# Patient Record
Sex: Female | Born: 1990 | Race: Black or African American | Hispanic: No | Marital: Married | Smoking: Never smoker
Health system: Southern US, Community
[De-identification: ages and names within clinical notes are randomized; demographics above are authoritative.]

## PROBLEM LIST (undated history)

## (undated) DIAGNOSIS — F419 Anxiety disorder, unspecified: Secondary | ICD-10-CM

## (undated) DIAGNOSIS — J45909 Unspecified asthma, uncomplicated: Secondary | ICD-10-CM

## (undated) DIAGNOSIS — D563 Thalassemia minor: Secondary | ICD-10-CM

## (undated) HISTORY — PX: TONSILLECTOMY AND ADENOIDECTOMY: SHX28

---

## 2018-05-14 DIAGNOSIS — H1031 Unspecified acute conjunctivitis, right eye: Secondary | ICD-10-CM | POA: Diagnosis not present

## 2018-07-31 DIAGNOSIS — J4520 Mild intermittent asthma, uncomplicated: Secondary | ICD-10-CM | POA: Diagnosis not present

## 2018-07-31 DIAGNOSIS — J069 Acute upper respiratory infection, unspecified: Secondary | ICD-10-CM | POA: Diagnosis not present

## 2018-08-17 DIAGNOSIS — J069 Acute upper respiratory infection, unspecified: Secondary | ICD-10-CM | POA: Diagnosis not present

## 2018-08-17 DIAGNOSIS — R6889 Other general symptoms and signs: Secondary | ICD-10-CM | POA: Diagnosis not present

## 2018-10-02 DIAGNOSIS — J4520 Mild intermittent asthma, uncomplicated: Secondary | ICD-10-CM | POA: Diagnosis not present

## 2018-10-02 DIAGNOSIS — Z0000 Encounter for general adult medical examination without abnormal findings: Secondary | ICD-10-CM | POA: Diagnosis not present

## 2018-10-02 DIAGNOSIS — Z23 Encounter for immunization: Secondary | ICD-10-CM | POA: Diagnosis not present

## 2019-01-25 DIAGNOSIS — Z7951 Long term (current) use of inhaled steroids: Secondary | ICD-10-CM | POA: Diagnosis not present

## 2019-01-25 DIAGNOSIS — N83201 Unspecified ovarian cyst, right side: Secondary | ICD-10-CM | POA: Diagnosis not present

## 2019-01-25 DIAGNOSIS — N939 Abnormal uterine and vaginal bleeding, unspecified: Secondary | ICD-10-CM | POA: Diagnosis not present

## 2019-01-25 DIAGNOSIS — N83209 Unspecified ovarian cyst, unspecified side: Secondary | ICD-10-CM | POA: Diagnosis not present

## 2019-01-25 DIAGNOSIS — J45909 Unspecified asthma, uncomplicated: Secondary | ICD-10-CM | POA: Diagnosis not present

## 2019-01-25 DIAGNOSIS — Z975 Presence of (intrauterine) contraceptive device: Secondary | ICD-10-CM | POA: Diagnosis not present

## 2019-01-25 DIAGNOSIS — N83202 Unspecified ovarian cyst, left side: Secondary | ICD-10-CM | POA: Diagnosis not present

## 2019-01-25 DIAGNOSIS — R102 Pelvic and perineal pain: Secondary | ICD-10-CM | POA: Diagnosis not present

## 2019-04-22 DIAGNOSIS — Z30432 Encounter for removal of intrauterine contraceptive device: Secondary | ICD-10-CM | POA: Diagnosis not present

## 2019-05-14 DIAGNOSIS — L02429 Furuncle of limb, unspecified: Secondary | ICD-10-CM | POA: Diagnosis not present

## 2019-08-07 DIAGNOSIS — M25511 Pain in right shoulder: Secondary | ICD-10-CM | POA: Diagnosis not present

## 2019-08-07 DIAGNOSIS — F419 Anxiety disorder, unspecified: Secondary | ICD-10-CM | POA: Diagnosis not present

## 2019-08-07 DIAGNOSIS — F322 Major depressive disorder, single episode, severe without psychotic features: Secondary | ICD-10-CM | POA: Diagnosis not present

## 2019-08-27 DIAGNOSIS — N912 Amenorrhea, unspecified: Secondary | ICD-10-CM | POA: Diagnosis not present

## 2019-08-29 DIAGNOSIS — F320 Major depressive disorder, single episode, mild: Secondary | ICD-10-CM | POA: Diagnosis not present

## 2019-08-29 DIAGNOSIS — F909 Attention-deficit hyperactivity disorder, unspecified type: Secondary | ICD-10-CM | POA: Diagnosis not present

## 2019-09-10 DIAGNOSIS — F320 Major depressive disorder, single episode, mild: Secondary | ICD-10-CM | POA: Diagnosis not present

## 2019-09-10 DIAGNOSIS — F909 Attention-deficit hyperactivity disorder, unspecified type: Secondary | ICD-10-CM | POA: Diagnosis not present

## 2019-10-01 DIAGNOSIS — O99211 Obesity complicating pregnancy, first trimester: Secondary | ICD-10-CM | POA: Diagnosis not present

## 2019-10-01 DIAGNOSIS — Z3201 Encounter for pregnancy test, result positive: Secondary | ICD-10-CM | POA: Diagnosis not present

## 2019-10-01 DIAGNOSIS — Z113 Encounter for screening for infections with a predominantly sexual mode of transmission: Secondary | ICD-10-CM | POA: Diagnosis not present

## 2019-10-01 DIAGNOSIS — D649 Anemia, unspecified: Secondary | ICD-10-CM | POA: Diagnosis not present

## 2019-10-01 DIAGNOSIS — Z3400 Encounter for supervision of normal first pregnancy, unspecified trimester: Secondary | ICD-10-CM | POA: Diagnosis not present

## 2019-10-01 DIAGNOSIS — O3680X0 Pregnancy with inconclusive fetal viability, not applicable or unspecified: Secondary | ICD-10-CM | POA: Diagnosis not present

## 2019-10-01 DIAGNOSIS — Z8709 Personal history of other diseases of the respiratory system: Secondary | ICD-10-CM | POA: Diagnosis not present

## 2019-10-01 DIAGNOSIS — Z3401 Encounter for supervision of normal first pregnancy, first trimester: Secondary | ICD-10-CM | POA: Diagnosis not present

## 2019-10-01 DIAGNOSIS — Z3A09 9 weeks gestation of pregnancy: Secondary | ICD-10-CM | POA: Diagnosis not present

## 2019-11-26 DIAGNOSIS — D649 Anemia, unspecified: Secondary | ICD-10-CM | POA: Diagnosis not present

## 2019-11-26 DIAGNOSIS — O99211 Obesity complicating pregnancy, first trimester: Secondary | ICD-10-CM | POA: Diagnosis not present

## 2019-11-26 DIAGNOSIS — E010 Iodine-deficiency related diffuse (endemic) goiter: Secondary | ICD-10-CM | POA: Diagnosis not present

## 2019-11-26 DIAGNOSIS — R718 Other abnormality of red blood cells: Secondary | ICD-10-CM | POA: Diagnosis not present

## 2019-12-10 DIAGNOSIS — Z3689 Encounter for other specified antenatal screening: Secondary | ICD-10-CM | POA: Diagnosis not present

## 2020-01-07 DIAGNOSIS — O352XX0 Maternal care for (suspected) hereditary disease in fetus, not applicable or unspecified: Secondary | ICD-10-CM | POA: Diagnosis not present

## 2020-01-07 DIAGNOSIS — Z3A23 23 weeks gestation of pregnancy: Secondary | ICD-10-CM | POA: Diagnosis not present

## 2020-02-05 DIAGNOSIS — Z3400 Encounter for supervision of normal first pregnancy, unspecified trimester: Secondary | ICD-10-CM | POA: Diagnosis not present

## 2020-02-08 ENCOUNTER — Inpatient Hospital Stay (HOSPITAL_COMMUNITY)
Admission: AD | Admit: 2020-02-08 | Discharge: 2020-02-08 | Disposition: A | Discharge status: Home / Self Care | Payer: 59 | Attending: Obstetrics & Gynecology | Admitting: Obstetrics & Gynecology

## 2020-02-08 ENCOUNTER — Other Ambulatory Visit: Payer: Self-pay

## 2020-02-08 ENCOUNTER — Encounter (HOSPITAL_COMMUNITY): Payer: Self-pay | Admitting: Obstetrics & Gynecology

## 2020-02-08 DIAGNOSIS — Z882 Allergy status to sulfonamides status: Secondary | ICD-10-CM | POA: Diagnosis not present

## 2020-02-08 DIAGNOSIS — O26892 Other specified pregnancy related conditions, second trimester: Secondary | ICD-10-CM | POA: Insufficient documentation

## 2020-02-08 DIAGNOSIS — O99342 Other mental disorders complicating pregnancy, second trimester: Secondary | ICD-10-CM | POA: Insufficient documentation

## 2020-02-08 DIAGNOSIS — J45909 Unspecified asthma, uncomplicated: Secondary | ICD-10-CM | POA: Insufficient documentation

## 2020-02-08 DIAGNOSIS — R5383 Other fatigue: Secondary | ICD-10-CM | POA: Diagnosis not present

## 2020-02-08 DIAGNOSIS — Z79899 Other long term (current) drug therapy: Secondary | ICD-10-CM | POA: Diagnosis not present

## 2020-02-08 DIAGNOSIS — Z3A27 27 weeks gestation of pregnancy: Secondary | ICD-10-CM | POA: Insufficient documentation

## 2020-02-08 DIAGNOSIS — R42 Dizziness and giddiness: Secondary | ICD-10-CM | POA: Diagnosis not present

## 2020-02-08 DIAGNOSIS — F419 Anxiety disorder, unspecified: Secondary | ICD-10-CM | POA: Diagnosis not present

## 2020-02-08 DIAGNOSIS — D563 Thalassemia minor: Secondary | ICD-10-CM | POA: Diagnosis not present

## 2020-02-08 DIAGNOSIS — O99512 Diseases of the respiratory system complicating pregnancy, second trimester: Secondary | ICD-10-CM | POA: Diagnosis not present

## 2020-02-08 HISTORY — DX: Anxiety disorder, unspecified: F41.9

## 2020-02-08 HISTORY — DX: Thalassemia minor: D56.3

## 2020-02-08 HISTORY — DX: Unspecified asthma, uncomplicated: J45.909

## 2020-02-08 LAB — URINALYSIS, ROUTINE W REFLEX MICROSCOPIC
Bilirubin Urine: NEGATIVE
Glucose, UA: NEGATIVE mg/dL
Hgb urine dipstick: NEGATIVE
Ketones, ur: 20 mg/dL — AB
Nitrite: NEGATIVE
Protein, ur: NEGATIVE mg/dL
Specific Gravity, Urine: 1.011 (ref 1.005–1.030)
pH: 6 (ref 5.0–8.0)

## 2020-02-08 LAB — COMPREHENSIVE METABOLIC PANEL
ALT: 41 U/L (ref 0–44)
AST: 32 U/L (ref 15–41)
Albumin: 2.7 g/dL — ABNORMAL LOW (ref 3.5–5.0)
Alkaline Phosphatase: 101 U/L (ref 38–126)
Anion gap: 9 (ref 5–15)
BUN: 8 mg/dL (ref 6–20)
CO2: 20 mmol/L — ABNORMAL LOW (ref 22–32)
Calcium: 8.7 mg/dL — ABNORMAL LOW (ref 8.9–10.3)
Chloride: 105 mmol/L (ref 98–111)
Creatinine, Ser: 0.49 mg/dL (ref 0.44–1.00)
GFR calc Af Amer: >60 mL/min (ref >60)
GFR calc non Af Amer: >60 mL/min (ref >60)
Glucose, Bld: 72 mg/dL (ref 70–99)
Potassium: 3.9 mmol/L (ref 3.5–5.1)
Sodium: 134 mmol/L — ABNORMAL LOW (ref 135–145)
Total Bilirubin: 0.3 mg/dL (ref 0.3–1.2)
Total Protein: 6.5 g/dL (ref 6.5–8.1)

## 2020-02-08 LAB — CBC
HCT: 33.2 % — ABNORMAL LOW (ref 36.0–46.0)
Hemoglobin: 10.1 g/dL — ABNORMAL LOW (ref 12.0–15.0)
MCH: 23.2 pg — ABNORMAL LOW (ref 26.0–34.0)
MCHC: 30.4 g/dL (ref 30.0–36.0)
MCV: 76.3 fL — ABNORMAL LOW (ref 80.0–100.0)
Platelets: 219 10*3/uL (ref 150–400)
RBC: 4.35 MIL/uL (ref 3.87–5.11)
RDW: 14.7 % (ref 11.5–15.5)
WBC: 8.6 10*3/uL (ref 4.0–10.5)
nRBC: 0 % (ref 0.0–0.2)

## 2020-02-08 NOTE — MAU Provider Note (Signed)
History     CSN: [id number]  Arrival date and time: 02/08/20 1638   First Provider Initiated Contact with Patient 02/08/20 1726      Chief Complaint  Patient presents with  . Dizziness  . Nausea  . Emesis  . Abdominal Pain   HPI Sandra Caldwell is a 29 y.o. G1P0 at [redacted]w[redacted]d who presents with dizziness and fatigue. 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 She states she ate only pizza toppings and a spoonful of cake at lunch. She reports 2 tumblers of water total today. She denies any leaking or vaginal bleeding. Reports normal fetal movement.   OB History    Gravida  1   Para      Term      Preterm      AB      Living        SAB      TAB      Ectopic      Multiple      Live Births              Past Medical History:  Diagnosis Date  . Anxiety   . Asthma   . Thalassemia minor     History reviewed. No pertinent surgical history.  History reviewed. No pertinent family history.  Social History   Tobacco Use  . Smoking status: Never Smoker  . Smokeless tobacco: Never Used  Vaping Use  . Vaping Use: Never used  Substance Use Topics  . Alcohol use: Not Currently  . Drug use: Never    Allergies:  Allergies  Allergen Reactions  . Sulfa Antibiotics Nausea Only    Medications Prior to Admission  Medication Sig Dispense Refill Last Dose  . albuterol (VENTOLIN HFA) 108 (90 Base) MCG/ACT inhaler Inhale 2 puffs into the lungs every 6 (six) hours as needed for wheezing or shortness of breath.     . busPIRone (BUSPAR) 10 MG tablet Take 10 mg by mouth 3 (three) times daily. Pt states up to 3 times a day as needed.   Past Week at Unknown time  . ondansetron (ZOFRAN) 4 MG tablet Take 4 mg by mouth every 8 (eight) hours as needed for nausea or vomiting.   Past Month at Unknown time  . Prenatal Vit-Fe Fumarate-FA (PRENATAL MULTIVITAMIN) TABS tablet Take 1 tablet by mouth daily at  12 noon.   02/08/2020 at Unknown time    Review of Systems  Constitutional: Negative.  Negative for fatigue and fever.  HENT: Negative.   Respiratory: Negative.  Negative for shortness of breath.   Cardiovascular: Negative.  Negative for chest pain.  Gastrointestinal: Negative.  Negative for abdominal pain, constipation, diarrhea, nausea and vomiting.  Genitourinary: Negative.  Negative for dysuria, vaginal bleeding and vaginal discharge.  Neurological: Positive for dizziness and light-headedness. Negative for headaches.   Physical Exam   Blood pressure 114/69, pulse 99, temperature 98.3 F (36.8 C), temperature source Oral, resp. rate 16, height 5\' 4"  (1.626 m), weight 86.5 kg, SpO2 98 %.  Physical Exam Vitals and nursing note reviewed.  Constitutional:      General: She is not in acute distress.    Appearance: She is well-developed.  HENT:     Head: Normocephalic.  Eyes:     Pupils: Pupils are equal, round, and reactive to light.  Cardiovascular:     Rate and Rhythm: Normal rate and regular  rhythm.     Heart sounds: Normal heart sounds.  Pulmonary:     Effort: Pulmonary effort is normal. No respiratory distress.     Breath sounds: Normal breath sounds.  Abdominal:     General: Bowel sounds are normal. There is no distension.     Palpations: Abdomen is soft.     Tenderness: There is no abdominal tenderness.  Skin:    General: Skin is warm and dry.  Neurological:     Mental Status: She is alert and oriented to person, place, and time.  Psychiatric:        Behavior: Behavior normal.        Thought Content: Thought content normal.        Judgment: Judgment normal.    Fetal Tracing:  Baseline: 140 Variability: moderate  Accels: 10x10 Decels: none  Toco: none   MAU Course  Procedures Results for orders placed or performed during the hospital encounter of 02/08/20 (from the past 24 hour(s))  Urinalysis, Routine w reflex microscopic     Status: Abnormal    Collection Time: 02/08/20  5:05 PM  Result Value Ref Range   Color, Urine YELLOW YELLOW   APPearance CLEAR CLEAR   Specific Gravity, Urine 1.011 1.005 - 1.030   pH 6.0 5.0 - 8.0   Glucose, UA NEGATIVE NEGATIVE mg/dL   Hgb urine dipstick NEGATIVE NEGATIVE   Bilirubin Urine NEGATIVE NEGATIVE   Ketones, ur 20 (A) NEGATIVE mg/dL   Protein, ur NEGATIVE NEGATIVE mg/dL   Nitrite NEGATIVE NEGATIVE   Leukocytes,Ua TRACE (A) NEGATIVE   RBC / HPF 0-5 0 - 5 RBC/hpf   WBC, UA 0-5 0 - 5 WBC/hpf   Bacteria, UA RARE (A) NONE SEEN   Squamous Epithelial / LPF 0-5 0 - 5   Mucus PRESENT   CBC     Status: Abnormal   Collection Time: 02/08/20  6:03 PM  Result Value Ref Range   WBC 8.6 4.0 - 10.5 K/uL   RBC 4.35 3.87 - 5.11 MIL/uL   Hemoglobin 10.1 (L) 12.0 - 15.0 g/dL   HCT 25.4 (L) 36 - 46 %   MCV 76.3 (L) 80.0 - 100.0 fL   MCH 23.2 (L) 26.0 - 34.0 pg   MCHC 30.4 30.0 - 36.0 g/dL   RDW 27.0 62.3 - 76.2 %   Platelets 219 150 - 400 K/uL   nRBC 0.0 0.0 - 0.2 %  Comprehensive metabolic panel     Status: Abnormal   Collection Time: 02/08/20  6:03 PM  Result Value Ref Range   Sodium 134 (L) 135 - 145 mmol/L   Potassium 3.9 3.5 - 5.1 mmol/L   Chloride 105 98 - 111 mmol/L   CO2 20 (L) 22 - 32 mmol/L   Glucose, Bld 72 70 - 99 mg/dL   BUN 8 6 - 20 mg/dL   Creatinine, Ser 8.31 0.44 - 1.00 mg/dL   Calcium 8.7 (L) 8.9 - 10.3 mg/dL   Total Protein 6.5 6.5 - 8.1 g/dL   Albumin 2.7 (L) 3.5 - 5.0 g/dL   AST 32 15 - 41 U/L   ALT 41 0 - 44 U/L   Alkaline Phosphatase 101 38 - 126 U/L   Total Bilirubin 0.3 0.3 - 1.2 mg/dL   GFR calc non Af Amer >60 >60 mL/min   GFR calc Af Amer >60 >60 mL/min   Anion gap 9 5 - 15   MDM UA CBC, CMP per patient request  Lengthy discussion with  patient about proper nutrition in pregnancy and increasing PO hydration  Assessment and Plan   1. Dizziness   2. Fatigue, unspecified type   3. [redacted] weeks gestation of pregnancy    -Discharge home in stable  condition -Preterm precautions discussed -Patient advised to follow-up with OB in [place] as scheduled for prenatal care -Patient may return to MAU as needed or if her condition were to change or worsen   Rolm Bookbinder CNM 02/08/2020, 5:26 PM

## 2020-02-08 NOTE — Discharge Instructions (Signed)

## 2020-02-08 NOTE — MAU Note (Signed)
Sandra Caldwell is a 29 y.o. at [redacted]w[redacted]d here in MAU reporting: been feeling, dizzy, fatigue and has been seeing spots. This started around 2. Having some n/v. Emesis x 2 today. Having some abdominal pain. No discharge or bleeding. +FM  Onset of complaint: today  Pain score: 4/10  Vitals:   02/08/20 1655  BP: 121/77  Pulse: (!) 102  Resp: 16  Temp: 98.3 F (36.8 C)  SpO2: 100%     FHT: +FM  Lab orders placed from triage: UA

## 2020-02-17 DIAGNOSIS — Z3400 Encounter for supervision of normal first pregnancy, unspecified trimester: Secondary | ICD-10-CM | POA: Diagnosis not present

## 2020-02-17 DIAGNOSIS — O99810 Abnormal glucose complicating pregnancy: Secondary | ICD-10-CM | POA: Diagnosis not present

## 2020-03-03 DIAGNOSIS — Z3493 Encounter for supervision of normal pregnancy, unspecified, third trimester: Secondary | ICD-10-CM | POA: Diagnosis not present

## 2020-03-03 DIAGNOSIS — Z3400 Encounter for supervision of normal first pregnancy, unspecified trimester: Secondary | ICD-10-CM | POA: Diagnosis not present

## 2020-03-03 DIAGNOSIS — Z23 Encounter for immunization: Secondary | ICD-10-CM | POA: Diagnosis not present

## 2020-03-27 DIAGNOSIS — Z3483 Encounter for supervision of other normal pregnancy, third trimester: Secondary | ICD-10-CM | POA: Diagnosis not present

## 2020-03-27 DIAGNOSIS — Z3482 Encounter for supervision of other normal pregnancy, second trimester: Secondary | ICD-10-CM | POA: Diagnosis not present

## 2020-03-31 DIAGNOSIS — R05 Cough: Secondary | ICD-10-CM | POA: Diagnosis not present

## 2020-03-31 DIAGNOSIS — Z882 Allergy status to sulfonamides status: Secondary | ICD-10-CM | POA: Diagnosis not present

## 2020-03-31 DIAGNOSIS — Z79899 Other long term (current) drug therapy: Secondary | ICD-10-CM | POA: Diagnosis not present

## 2020-03-31 DIAGNOSIS — R000 Tachycardia, unspecified: Secondary | ICD-10-CM | POA: Diagnosis not present

## 2020-03-31 DIAGNOSIS — O99891 Other specified diseases and conditions complicating pregnancy: Secondary | ICD-10-CM | POA: Diagnosis not present

## 2020-03-31 DIAGNOSIS — R519 Headache, unspecified: Secondary | ICD-10-CM | POA: Diagnosis not present

## 2020-03-31 DIAGNOSIS — U071 COVID-19: Secondary | ICD-10-CM | POA: Diagnosis not present

## 2020-03-31 DIAGNOSIS — R509 Fever, unspecified: Secondary | ICD-10-CM | POA: Diagnosis not present

## 2020-03-31 DIAGNOSIS — Z3A00 Weeks of gestation of pregnancy not specified: Secondary | ICD-10-CM | POA: Diagnosis not present

## 2020-03-31 DIAGNOSIS — R110 Nausea: Secondary | ICD-10-CM | POA: Diagnosis not present

## 2020-04-06 ENCOUNTER — Inpatient Hospital Stay (HOSPITAL_COMMUNITY)
Admission: AD | Admit: 2020-04-06 | Discharge: 2020-04-08 | DRG: 831 | Disposition: A | Discharge status: Home / Self Care | Payer: 59 | Attending: Obstetrics and Gynecology | Admitting: Obstetrics and Gynecology

## 2020-04-06 ENCOUNTER — Inpatient Hospital Stay (HOSPITAL_COMMUNITY): Payer: 59

## 2020-04-06 ENCOUNTER — Encounter (HOSPITAL_COMMUNITY): Payer: Self-pay | Admitting: Obstetrics and Gynecology

## 2020-04-06 ENCOUNTER — Other Ambulatory Visit: Payer: Self-pay

## 2020-04-06 DIAGNOSIS — D563 Thalassemia minor: Secondary | ICD-10-CM | POA: Diagnosis present

## 2020-04-06 DIAGNOSIS — R0602 Shortness of breath: Secondary | ICD-10-CM

## 2020-04-06 DIAGNOSIS — J45909 Unspecified asthma, uncomplicated: Secondary | ICD-10-CM | POA: Diagnosis present

## 2020-04-06 DIAGNOSIS — J069 Acute upper respiratory infection, unspecified: Secondary | ICD-10-CM | POA: Diagnosis present

## 2020-04-06 DIAGNOSIS — J1282 Pneumonia due to coronavirus disease 2019: Secondary | ICD-10-CM | POA: Diagnosis present

## 2020-04-06 DIAGNOSIS — O98513 Other viral diseases complicating pregnancy, third trimester: Principal | ICD-10-CM | POA: Diagnosis present

## 2020-04-06 DIAGNOSIS — O99113 Other diseases of the blood and blood-forming organs and certain disorders involving the immune mechanism complicating pregnancy, third trimester: Secondary | ICD-10-CM | POA: Diagnosis present

## 2020-04-06 DIAGNOSIS — E861 Hypovolemia: Secondary | ICD-10-CM | POA: Diagnosis present

## 2020-04-06 DIAGNOSIS — U071 COVID-19: Secondary | ICD-10-CM | POA: Diagnosis not present

## 2020-04-06 DIAGNOSIS — E872 Acidosis: Secondary | ICD-10-CM | POA: Diagnosis present

## 2020-04-06 DIAGNOSIS — Z3A34 34 weeks gestation of pregnancy: Secondary | ICD-10-CM | POA: Diagnosis not present

## 2020-04-06 DIAGNOSIS — Z3A35 35 weeks gestation of pregnancy: Secondary | ICD-10-CM

## 2020-04-06 DIAGNOSIS — Z3A36 36 weeks gestation of pregnancy: Secondary | ICD-10-CM | POA: Diagnosis not present

## 2020-04-06 DIAGNOSIS — O99513 Diseases of the respiratory system complicating pregnancy, third trimester: Secondary | ICD-10-CM | POA: Diagnosis present

## 2020-04-06 DIAGNOSIS — D696 Thrombocytopenia, unspecified: Secondary | ICD-10-CM | POA: Diagnosis present

## 2020-04-06 DIAGNOSIS — O24410 Gestational diabetes mellitus in pregnancy, diet controlled: Secondary | ICD-10-CM | POA: Diagnosis present

## 2020-04-06 DIAGNOSIS — O99283 Endocrine, nutritional and metabolic diseases complicating pregnancy, third trimester: Secondary | ICD-10-CM | POA: Diagnosis present

## 2020-04-06 DIAGNOSIS — R42 Dizziness and giddiness: Secondary | ICD-10-CM | POA: Diagnosis not present

## 2020-04-06 DIAGNOSIS — R5383 Other fatigue: Secondary | ICD-10-CM | POA: Diagnosis not present

## 2020-04-06 LAB — COMPREHENSIVE METABOLIC PANEL
ALT: 22 U/L (ref 0–44)
AST: 41 U/L (ref 15–41)
Albumin: 2.4 g/dL — ABNORMAL LOW (ref 3.5–5.0)
Alkaline Phosphatase: 262 U/L — ABNORMAL HIGH (ref 38–126)
Anion gap: 15 (ref 5–15)
BUN: 10 mg/dL (ref 6–20)
CO2: 13 mmol/L — ABNORMAL LOW (ref 22–32)
Calcium: 8.4 mg/dL — ABNORMAL LOW (ref 8.9–10.3)
Chloride: 108 mmol/L (ref 98–111)
Creatinine, Ser: 0.94 mg/dL (ref 0.44–1.00)
GFR calc Af Amer: >60 mL/min (ref >60)
GFR calc non Af Amer: >60 mL/min (ref >60)
Glucose, Bld: 84 mg/dL (ref 70–99)
Potassium: 3.8 mmol/L (ref 3.5–5.1)
Sodium: 136 mmol/L (ref 135–145)
Total Bilirubin: 0.8 mg/dL (ref 0.3–1.2)
Total Protein: 6.4 g/dL — ABNORMAL LOW (ref 6.5–8.1)

## 2020-04-06 LAB — URINALYSIS, ROUTINE W REFLEX MICROSCOPIC
Bilirubin Urine: NEGATIVE
Glucose, UA: NEGATIVE mg/dL
Hgb urine dipstick: NEGATIVE
Ketones, ur: 20 mg/dL — AB
Nitrite: NEGATIVE
Protein, ur: NEGATIVE mg/dL
Specific Gravity, Urine: 1.005 (ref 1.005–1.030)
pH: 6 (ref 5.0–8.0)

## 2020-04-06 LAB — CBC WITH DIFFERENTIAL/PLATELET
Abs Immature Granulocytes: 0.08 10*3/uL — ABNORMAL HIGH (ref 0.00–0.07)
Basophils Absolute: 0 10*3/uL (ref 0.0–0.1)
Basophils Relative: 1 %
Eosinophils Absolute: 0 10*3/uL (ref 0.0–0.5)
Eosinophils Relative: 0 %
HCT: 43.1 % (ref 36.0–46.0)
Hemoglobin: 13.1 g/dL (ref 12.0–15.0)
Immature Granulocytes: 2 %
Lymphocytes Relative: 19 %
Lymphs Abs: 0.7 10*3/uL (ref 0.7–4.0)
MCH: 23 pg — ABNORMAL LOW (ref 26.0–34.0)
MCHC: 30.4 g/dL (ref 30.0–36.0)
MCV: 75.6 fL — ABNORMAL LOW (ref 80.0–100.0)
Monocytes Absolute: 0.2 10*3/uL (ref 0.1–1.0)
Monocytes Relative: 5 %
Neutro Abs: 2.5 10*3/uL (ref 1.7–7.7)
Neutrophils Relative %: 73 %
Platelets: 123 10*3/uL — ABNORMAL LOW (ref 150–400)
RBC: 5.7 MIL/uL — ABNORMAL HIGH (ref 3.87–5.11)
RDW: 15.7 % — ABNORMAL HIGH (ref 11.5–15.5)
WBC: 3.6 10*3/uL — ABNORMAL LOW (ref 4.0–10.5)
nRBC: 0 % (ref 0.0–0.2)

## 2020-04-06 LAB — TROPONIN I (HIGH SENSITIVITY)
Troponin I (High Sensitivity): 9 ng/L (ref <18)
Troponin I (High Sensitivity): 9 ng/L (ref <18)

## 2020-04-06 LAB — C-REACTIVE PROTEIN: CRP: 7.1 mg/dL — ABNORMAL HIGH (ref <1.0)

## 2020-04-06 LAB — D-DIMER, QUANTITATIVE: D-Dimer, Quant: 5.46 ug/mL-FEU — ABNORMAL HIGH (ref 0.00–0.50)

## 2020-04-06 LAB — BRAIN NATRIURETIC PEPTIDE: B Natriuretic Peptide: 36.5 pg/mL (ref 0.0–100.0)

## 2020-04-06 LAB — SARS CORONAVIRUS 2 BY RT PCR (HOSPITAL ORDER, PERFORMED IN ~~LOC~~ HOSPITAL LAB): SARS Coronavirus 2: POSITIVE — AB

## 2020-04-06 MED ORDER — SODIUM CHLORIDE 0.9 % IV SOLN
100.0000 mg | Freq: Every day | INTRAVENOUS | Status: DC
  Administered 2020-04-07: 100 mg via INTRAVENOUS
  Filled 2020-04-06 (×3): qty 20

## 2020-04-06 MED ORDER — LACTATED RINGERS IV SOLN: INTRAVENOUS | Status: DC

## 2020-04-06 MED ORDER — DOCUSATE SODIUM 100 MG PO CAPS
100.0000 mg | ORAL_CAPSULE | Freq: Every day | ORAL | Status: DC
  Administered 2020-04-07 – 2020-04-08 (×2): 100 mg via ORAL
  Filled 2020-04-06 (×2): qty 1

## 2020-04-06 MED ORDER — SODIUM CHLORIDE 0.9 % IV SOLN
200.0000 mg | Freq: Once | INTRAVENOUS | Status: AC
  Administered 2020-04-07: 200 mg via INTRAVENOUS
  Filled 2020-04-06: qty 40

## 2020-04-06 MED ORDER — PREDNISONE 20 MG PO TABS: 50.0000 mg | ORAL_TABLET | Freq: Every day | ORAL | Status: DC

## 2020-04-06 MED ORDER — METHYLPREDNISOLONE SODIUM SUCC 125 MG IJ SOLR: 1.0000 mg/kg | Freq: Two times a day (BID) | INTRAMUSCULAR | Status: DC

## 2020-04-06 MED ORDER — CALCIUM CARBONATE ANTACID 500 MG PO CHEW
2.0000 | CHEWABLE_TABLET | ORAL | Status: DC | PRN
  Administered 2020-04-07 – 2020-04-08 (×3): 400 mg via ORAL
  Filled 2020-04-06 (×3): qty 2

## 2020-04-06 MED ORDER — METHYLPREDNISOLONE SODIUM SUCC 125 MG IJ SOLR
0.5000 mg/kg | Freq: Two times a day (BID) | INTRAMUSCULAR | Status: DC
  Administered 2020-04-07 – 2020-04-08 (×4): 41.875 mg via INTRAVENOUS
  Filled 2020-04-06 (×4): qty 2

## 2020-04-06 MED ORDER — ZOLPIDEM TARTRATE 5 MG PO TABS: 5.0000 mg | ORAL_TABLET | Freq: Every evening | ORAL | Status: DC | PRN

## 2020-04-06 MED ORDER — PRENATAL MULTIVITAMIN CH
1.0000 | ORAL_TABLET | Freq: Every day | ORAL | Status: DC
  Administered 2020-04-07 – 2020-04-08 (×2): 1 via ORAL
  Filled 2020-04-06 (×2): qty 1

## 2020-04-06 MED ORDER — ACETAMINOPHEN 325 MG PO TABS
650.0000 mg | ORAL_TABLET | ORAL | Status: DC | PRN
  Administered 2020-04-07: 650 mg via ORAL
  Filled 2020-04-06: qty 2

## 2020-04-06 NOTE — Plan of Care (Signed)
  Problem: Education: Goal: Knowledge of General Education information will improve Description: Including pain rating scale, medication(s)/side effects and non-pharmacologic comfort measures Outcome: Completed/Met

## 2020-04-06 NOTE — Consult Note (Signed)
[facility] Medical Consultation  Sandra Caldwell VXB:[id number] DOB: 02-20-1991 DOA: 04/06/2020 PCP: Patient, No Pcp Per   Requesting physician: Dr. Donavan Foil. Date of consultation April 06, 2020. Reason for consultation: Treat Covid pneumonia.  Impression/Recommendations Principal Problem:   Acute respiratory disease due to COVID-19 virus Active Problems:   Shortness of breath    1. Acute respiratory failure secondary COVID-19 infection presently patient is tachypneic and tachycardic but not hypoxic.  He easily gets short of breath.  Inflammatory markers are elevated with CRP of 7.1 and D-dimer of 5.4 with chest x-ray showing bilateral infiltrates and patient being symptomatic we will start patient on steroids and remdesivir.  Closely monitor respiratory status including markers. 2. Diarrhea likely from Covid infection.  Patient does state that she at times has blood-tinged diarrhea.  Closely monitor.  Follow CBC. 3. History of asthma presently not actively wheezing.  We will keep patient on as needed albuterol inhaler. 4. Thrombocytopenia could be from Covid infection.  AST ALT and alkaline phosphatase and bilirubin are normal.  Closely monitor. 5. Dizziness and blurry vision I suspect is from dehydration.  Appears nonfocal.  Patient has been started on fluids by the OB/GYN service will closely monitor. 6. 35 weeks pregnancy being monitored by OB/GYN.  I will followup again tomorrow. Please contact me if I can be of assistance in the meanwhile. Thank you for this consultation.  Chief Complaint: Shortness of breath.  HPI:  29 year old female with history of asthma has been experiencing shortness of breath and diarrhea fatigue and weakness dizziness over the last 1 week.  Patient states her husband was diagnosed with COVID-19 infection and 2 days later she started pain symptoms.  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  Previously her  symptoms got worse started feeling fatigue weakness and some dizziness and blurry vision.  Not have any weakness of the upper or lower extremity did not have any headache.  Patient had diarrhea at times blood-tinged.  No nausea or vomiting.  Review of Systems:  As presented in the history of present illness nothing else significant.  Past Medical History:  Diagnosis Date  . Anxiety   . Asthma   . Thalassemia minor    No past surgical history on file. Social History:  reports that she has never smoked. She has never used smokeless tobacco. She reports previous alcohol use. She reports that she does not use drugs.  Allergies  Allergen Reactions  . Sulfa Antibiotics Nausea Only   No family history on file.  Prior to Admission medications   Medication Sig Start Date End Date Taking? Authorizing Provider  acetaminophen (TYLENOL) 500 MG tablet Take 500 mg by mouth every 6 (six) hours as needed.   Yes [provider]  albuterol (VENTOLIN HFA) 108 (90 Base) MCG/ACT inhaler Inhale 2 puffs into the lungs every 6 (six) hours as needed for wheezing or shortness of breath.   Yes [provider]  ondansetron (ZOFRAN) 4 MG tablet Take 4 mg by mouth every 8 (eight) hours as needed for nausea or vomiting.   Yes [provider]  Prenatal Vit-Fe Fumarate-FA (PRENATAL MULTIVITAMIN) TABS tablet Take 1 tablet by mouth daily at 12 noon.   Yes [provider]  busPIRone (BUSPAR) 10 MG tablet Take 10 mg by mouth 3 (three) times daily. Pt states up to 3 times a day as needed.    [provider]   Physical Exam: Blood pressure  119/80, pulse 98, temperature 97.7 F (36.5 C), resp. rate 18, SpO2 99 %. Vitals:   04/06/20 2044 04/06/20 2049  BP:    Pulse:    Resp:    Temp:    SpO2: 99% 99%     General: Moderately built and nourished.  Eyes: Anicteric no pallor.  ENT: No discharge from the ears eyes nose or mouth.  Neck: No mass felt.  No neck  rigidity.  Cardiovascular: S1-S2 heard.  Respiratory: No rhonchi or crepitations.  Abdomen: Distended nontender.  Skin: No rash.  Musculoskeletal: No edema.  Psychiatric: Appears normal.  Neurologic: Alert awake oriented to time place and person.  Moves all extremities 5 x 5.  Pupils are equal and reacting to light.  No facial asymmetry.  Labs on Admission:  Basic Metabolic Panel: Recent Labs  Lab 04/06/20 1819  NA 136  K 3.8  CL 108  CO2 13*  GLUCOSE 84  BUN 10  CREATININE 0.94  CALCIUM 8.4*   Liver Function Tests: Recent Labs  Lab 04/06/20 1819  AST 41  ALT 22  ALKPHOS 262*  BILITOT 0.8  PROT 6.4*  ALBUMIN 2.4*   No results for input(s): LIPASE, AMYLASE in the last 168 hours. No results for input(s): AMMONIA in the last 168 hours. CBC: Recent Labs  Lab 04/06/20 1819  WBC 3.6*  NEUTROABS 2.5  HGB 13.1  HCT 43.1  MCV 75.6*  PLT 123*   Cardiac Enzymes: No results for input(s): CKTOTAL, CKMB, CKMBINDEX, TROPONINI in the last 168 hours. BNP: Invalid input(s): POCBNP CBG: No results for input(s): GLUCAP in the last 168 hours.  Radiological Exams on Admission: DG CHEST PORT 1 VIEW  Result Date: 04/06/2020 CLINICAL DATA:  Short of breath, dizziness and fatigue. Positive COVID-19 test last week. Thirty-six weeks pregnant. EXAM: PORTABLE CHEST 1 VIEW COMPARISON:  None. FINDINGS: Questionable subtle airspace opacities in the lower lungs. Lungs otherwise clear. No pleural effusion or pneumothorax. Normal heart, mediastinum and hila. Skeletal structures are unremarkable. IMPRESSION: 1. Possible subtle airspace opacities in the lower lungs. This is somewhat equivocal and would be better assessed with CT. Electronically Signed   By: Amie Portland M.D.   On: 04/06/2020 18:55     Time spent: 50 minutes.  Eduard Clos [facility].  If 7PM-7AM, please contact night-coverage [email or web address] Password [email or web address] 04/06/2020, 9:03 PM

## 2020-04-06 NOTE — MAU Note (Signed)
Pt dx with covid last Tuesday. Started having syptoms last Monday. Today she feels like she has been very short of breath  With a cough yellow tinged phlegm   and very tired and dizzy with some visual disturbances. Denies fever but has had body ache but that stopped yesterday. Taking tylenol 500mg  q 6-7 hrs. Stated her stool has been dark and has some blood in it. C/o some heartburn at this time. C/O nausea and poor appetite.

## 2020-04-06 NOTE — MAU Provider Note (Signed)
History     CSN: [id number]  Arrival date and time: 04/06/20 1722   None     Chief Complaint  Patient presents with  . Shortness of Breath  . Dizziness  . Fatigue   HPI  [name] is a 29 y.o. female G1P0 @ [redacted]w[redacted]d here in MAU with complaints of cough, shortness of breath, dizziness and fatigue.  She also feels like her heart is racing really fast. She reporots she is unable to get out of bed d/t the dizziness. Reports her vision is disorientated at times as well. She tested positive for Covid 19 on 8/31. She is not vaccinated.  + fetal movement. Husband is Covid positive as well.   OB History    Gravida  1   Para      Term      Preterm      AB      Living        SAB      TAB      Ectopic      Multiple      Live Births              Past Medical History:  Diagnosis Date  . Anxiety   . Asthma   . Thalassemia minor     No past surgical history on file.  No family history on file.  Social History   Tobacco Use  . Smoking status: Never Smoker  . Smokeless tobacco: Never Used  Vaping Use  . Vaping Use: Never used  Substance Use Topics  . Alcohol use: Not Currently  . Drug use: Never    Allergies:  Allergies  Allergen Reactions  . Sulfa Antibiotics Nausea Only    Medications Prior to Admission  Medication Sig Dispense Refill Last Dose  . acetaminophen (TYLENOL) 500 MG tablet Take 500 mg by mouth every 6 (six) hours as needed.   04/06/2020 at Unknown time  . albuterol (VENTOLIN HFA) 108 (90 Base) MCG/ACT inhaler Inhale 2 puffs into the lungs every 6 (six) hours as needed for wheezing or shortness of breath.   04/05/2020 at Unknown time  . ondansetron (ZOFRAN) 4 MG tablet Take 4 mg by mouth every 8 (eight) hours as needed for nausea or vomiting.   Past Month at Unknown time  . Prenatal Vit-Fe Fumarate-FA (PRENATAL MULTIVITAMIN) TABS tablet Take 1 tablet by mouth daily at 12 noon.   04/06/2020 at Unknown time  . busPIRone (BUSPAR) 10 MG tablet  Take 10 mg by mouth 3 (three) times daily. Pt states up to 3 times a day as needed.      Results for orders placed or performed during the hospital encounter of 04/06/20 (from the past 48 hour(s))  Urinalysis, Routine w reflex microscopic Urine, Clean Catch     Status: Abnormal   Collection Time: 04/06/20  6:01 PM  Result Value Ref Range   Color, Urine YELLOW YELLOW   APPearance HAZY (A) CLEAR   Specific Gravity, Urine 1.005 1.005 - 1.030   pH 6.0 5.0 - 8.0   Glucose, UA NEGATIVE NEGATIVE mg/dL   Hgb urine dipstick NEGATIVE NEGATIVE   Bilirubin Urine NEGATIVE NEGATIVE   Ketones, ur 20 (A) NEGATIVE mg/dL   Protein, ur NEGATIVE NEGATIVE mg/dL   Nitrite NEGATIVE NEGATIVE   Leukocytes,Ua LARGE (A) NEGATIVE   WBC, UA 21-50 0 - 5 WBC/hpf   Bacteria, UA MANY (A) NONE SEEN   Squamous Epithelial / LPF 6-10 0 - 5  Mucus PRESENT    Amorphous Crystal PRESENT     Comment: Performed at [facility], [address] [place], [place] [address]  CBC with Differential/Platelet     Status: Abnormal   Collection Time: 04/06/20  6:19 PM  Result Value Ref Range   WBC 3.6 (L) 4.0 - 10.5 K/uL   RBC 5.70 (H) 3.87 - 5.11 MIL/uL   Hemoglobin 13.1 12.0 - 15.0 g/dL   HCT 83.1 36 - 46 %   MCV 75.6 (L) 80.0 - 100.0 fL   MCH 23.0 (L) 26.0 - 34.0 pg   MCHC 30.4 30.0 - 36.0 g/dL   RDW 51.7 (H) 61.6 - 07.3 %   Platelets 123 (L) 150 - 400 K/uL    Comment: REPEATED TO VERIFY   nRBC 0.0 0.0 - 0.2 %   Neutrophils Relative % 73 %   Neutro Abs 2.5 1.7 - 7.7 K/uL   Lymphocytes Relative 19 %   Lymphs Abs 0.7 0.7 - 4.0 K/uL   Monocytes Relative 5 %   Monocytes Absolute 0.2 0 - 1 K/uL   Eosinophils Relative 0 %   Eosinophils Absolute 0.0 0 - 0 K/uL   Basophils Relative 1 %   Basophils Absolute 0.0 0 - 0 K/uL   Immature Granulocytes 2 %   Abs Immature Granulocytes 0.08 (H) 0.00 - 0.07 K/uL    Comment: Performed at [facility], [address], [place], [place] [address]  Brain natriuretic peptide      Status: None   Collection Time: 04/06/20  6:19 PM  Result Value Ref Range   B Natriuretic Peptide 36.5 0.0 - 100.0 pg/mL    Comment: Performed at [facility], [address] [place], [place] [address]  Comprehensive metabolic panel     Status: Abnormal   Collection Time: 04/06/20  6:19 PM  Result Value Ref Range   Sodium 136 135 - 145 mmol/L   Potassium 3.8 3.5 - 5.1 mmol/L   Chloride 108 98 - 111 mmol/L   CO2 13 (L) 22 - 32 mmol/L   Glucose, Bld 84 70 - 99 mg/dL    Comment: Glucose reference range applies only to samples taken after fasting for at least 8 hours.   BUN 10 6 - 20 mg/dL   Creatinine, Ser 4.62 0.44 - 1.00 mg/dL   Calcium 8.4 (L) 8.9 - 10.3 mg/dL   Total Protein 6.4 (L) 6.5 - 8.1 g/dL   Albumin 2.4 (L) 3.5 - 5.0 g/dL   AST 41 15 - 41 U/L   ALT 22 0 - 44 U/L   Alkaline Phosphatase 262 (H) 38 - 126 U/L   Total Bilirubin 0.8 0.3 - 1.2 mg/dL   GFR calc non Af Amer >60 >60 mL/min   GFR calc Af Amer >60 >60 mL/min   Anion gap 15 5 - 15    Comment: Performed at [facility], [address] [place], [place] [address]  Troponin I (High Sensitivity)     Status: None   Collection Time: 04/06/20  6:19 PM  Result Value Ref Range   Troponin I (High Sensitivity) 9 <18 ng/L    Comment: (NOTE) Elevated high sensitivity troponin I (hsTnI) values and significant  changes across serial measurements may suggest ACS but many other  chronic and acute conditions are known to elevate hsTnI results.  Refer to the "Links" section for chest pain algorithms and additional  guidance. Performed at [facility],  [address] [place], [place] [address]   Type and screen [facility]     Status: None (Preliminary result)   Collection Time: 04/06/20  6:30 PM  Result Value Ref Range   ABO/RH(D) PENDING    Antibody Screen PENDING    Sample Expiration      04/09/2020,2359 Performed at [facility], [address], [place], [place] [address]     DG CHEST PORT 1 VIEW  Result Date: 04/06/2020 CLINICAL DATA:  Short of breath, dizziness and fatigue. Positive COVID-19 test last week. Thirty-six weeks pregnant. EXAM: PORTABLE CHEST 1 VIEW COMPARISON:  None. FINDINGS: Questionable subtle airspace opacities in the lower lungs. Lungs otherwise clear. No pleural effusion or pneumothorax. Normal heart, mediastinum and hila. Skeletal structures are unremarkable. IMPRESSION: 1. Possible subtle airspace opacities in the lower lungs. This is somewhat equivocal and would be better assessed with CT. Electronically Signed   By: Amie Portland M.D.   On: 04/06/2020 18:55   Review of Systems  Constitutional: Positive for activity change, chills and fatigue. Negative for fever.  HENT: Positive for voice change.   Respiratory: Positive for cough and shortness of breath.   Neurological: Positive for headaches.   Physical Exam   Blood pressure 119/80, pulse 98, temperature 97.7 F (36.5 C), resp. rate 18, SpO2 98 %.  Physical Exam Constitutional:      Appearance: She is ill-appearing.  Pulmonary:     Effort: Tachypnea and accessory muscle usage present.     Breath sounds: Decreased air movement present. Examination of the right-upper field reveals decreased breath sounds. Examination of the left-upper field reveals decreased breath sounds. Examination of the right-middle field reveals decreased breath sounds. Examination of the left-middle field reveals decreased breath sounds. Examination of the right-lower field reveals decreased breath sounds. Examination of the left-lower field reveals decreased breath sounds. Decreased breath sounds present. No wheezing, rhonchi or rales.  Musculoskeletal:        General: Normal range of motion.  Skin:    General: Skin is warm.     Coloration: Skin is not cyanotic.  Neurological:     Mental Status: She is alert and oriented to person, place, and time.    Fetal Tracing: Baseline: 125 bpm Variability: Moderate   Accelerations: 15x15 Decelerations: None Toco: None  MAU Course  Procedures  None  MDM Cbc with diff BNP CMP troponin and C-Xray Discussed Patient with Dr. Donavan Foil, recommend admission based on patient's breathing pattern and cough. Hospitalist consulted and coming to MAU to see patient.    Assessment and Plan   1. Shortness of breath   2. COVID-19 affecting pregnancy in third trimester   3. [redacted] weeks gestation of pregnancy     P:  Admit to [facility] specialty    Mckinzi Eriksen, Harolyn Rutherford, NP 04/06/2020 7:59 PM

## 2020-04-07 ENCOUNTER — Encounter (HOSPITAL_COMMUNITY): Payer: Self-pay | Admitting: Obstetrics and Gynecology

## 2020-04-07 DIAGNOSIS — O24410 Gestational diabetes mellitus in pregnancy, diet controlled: Secondary | ICD-10-CM

## 2020-04-07 DIAGNOSIS — Z3A36 36 weeks gestation of pregnancy: Secondary | ICD-10-CM

## 2020-04-07 DIAGNOSIS — U071 COVID-19: Secondary | ICD-10-CM | POA: Diagnosis present

## 2020-04-07 DIAGNOSIS — O98513 Other viral diseases complicating pregnancy, third trimester: Principal | ICD-10-CM

## 2020-04-07 DIAGNOSIS — J1282 Pneumonia due to coronavirus disease 2019: Secondary | ICD-10-CM

## 2020-04-07 DIAGNOSIS — R0602 Shortness of breath: Secondary | ICD-10-CM | POA: Diagnosis present

## 2020-04-07 DIAGNOSIS — Z3A34 34 weeks gestation of pregnancy: Secondary | ICD-10-CM

## 2020-04-07 LAB — COMPREHENSIVE METABOLIC PANEL
ALT: 19 U/L (ref 0–44)
AST: 35 U/L (ref 15–41)
Albumin: 2.3 g/dL — ABNORMAL LOW (ref 3.5–5.0)
Alkaline Phosphatase: 245 U/L — ABNORMAL HIGH (ref 38–126)
Anion gap: 15 (ref 5–15)
BUN: 9 mg/dL (ref 6–20)
CO2: 12 mmol/L — ABNORMAL LOW (ref 22–32)
Calcium: 8.4 mg/dL — ABNORMAL LOW (ref 8.9–10.3)
Chloride: 109 mmol/L (ref 98–111)
Creatinine, Ser: 0.96 mg/dL (ref 0.44–1.00)
GFR calc Af Amer: >60 mL/min (ref >60)
GFR calc non Af Amer: >60 mL/min (ref >60)
Glucose, Bld: 95 mg/dL (ref 70–99)
Potassium: 3.9 mmol/L (ref 3.5–5.1)
Sodium: 136 mmol/L (ref 135–145)
Total Bilirubin: 0.9 mg/dL (ref 0.3–1.2)
Total Protein: 5.8 g/dL — ABNORMAL LOW (ref 6.5–8.1)

## 2020-04-07 LAB — TYPE AND SCREEN
ABO/RH(D): O POS
Antibody Screen: NEGATIVE

## 2020-04-07 LAB — GLUCOSE, CAPILLARY
Glucose-Capillary: 133 mg/dL — ABNORMAL HIGH (ref 70–99)
Glucose-Capillary: 107 mg/dL — ABNORMAL HIGH (ref 70–99)

## 2020-04-07 LAB — CBC WITH DIFFERENTIAL/PLATELET
Abs Immature Granulocytes: 0.08 10*3/uL — ABNORMAL HIGH (ref 0.00–0.07)
Basophils Absolute: 0 10*3/uL (ref 0.0–0.1)
Basophils Relative: 0 %
Eosinophils Absolute: 0 10*3/uL (ref 0.0–0.5)
Eosinophils Relative: 0 %
HCT: 40.4 % (ref 36.0–46.0)
Hemoglobin: 12.4 g/dL (ref 12.0–15.0)
Immature Granulocytes: 2 %
Lymphocytes Relative: 11 %
Lymphs Abs: 0.6 10*3/uL — ABNORMAL LOW (ref 0.7–4.0)
MCH: 23.4 pg — ABNORMAL LOW (ref 26.0–34.0)
MCHC: 30.7 g/dL (ref 30.0–36.0)
MCV: 76.1 fL — ABNORMAL LOW (ref 80.0–100.0)
Monocytes Absolute: 0.2 10*3/uL (ref 0.1–1.0)
Monocytes Relative: 3 %
Neutro Abs: 4.4 10*3/uL (ref 1.7–7.7)
Neutrophils Relative %: 84 %
Platelets: 155 10*3/uL (ref 150–400)
RBC: 5.31 MIL/uL — ABNORMAL HIGH (ref 3.87–5.11)
RDW: 15.5 % (ref 11.5–15.5)
WBC: 5.2 10*3/uL (ref 4.0–10.5)
nRBC: 0 % (ref 0.0–0.2)

## 2020-04-07 LAB — C-REACTIVE PROTEIN: CRP: 6 mg/dL — ABNORMAL HIGH (ref <1.0)

## 2020-04-07 LAB — HIV ANTIBODY (ROUTINE TESTING W REFLEX): HIV Screen 4th Generation wRfx: NONREACTIVE

## 2020-04-07 LAB — D-DIMER, QUANTITATIVE: D-Dimer, Quant: 4.21 ug/mL-FEU — ABNORMAL HIGH (ref 0.00–0.50)

## 2020-04-07 MED ORDER — ALBUTEROL SULFATE HFA 108 (90 BASE) MCG/ACT IN AERS
2.0000 | INHALATION_SPRAY | Freq: Four times a day (QID) | RESPIRATORY_TRACT | Status: DC | PRN
  Filled 2020-04-07: qty 6.7

## 2020-04-07 MED ORDER — ENOXAPARIN SODIUM 40 MG/0.4ML ~~LOC~~ SOLN
40.0000 mg | SUBCUTANEOUS | Status: DC
  Administered 2020-04-07 – 2020-04-08 (×2): 40 mg via SUBCUTANEOUS
  Filled 2020-04-07 (×2): qty 0.4

## 2020-04-07 NOTE — Progress Notes (Signed)
FACULTY PRACTICE ANTEPARTUM COMPREHENSIVE PROGRESS NOTE  Sandra Caldwell is a 29 y.o. G1P0 at 1435w0d who is admitted for symptomatic COVID infection.  Estimated Date of Delivery: 05/05/20 Fetal presentation is unsure.  Length of Stay:  1 Days. Admitted 04/06/2020  Subjective: Still reports some SOB with exertion, but okay when lying in bed.  Patient reports good fetal movement.  She reports no uterine contractions, no bleeding and no loss of fluid per vagina.  Vitals:  Blood pressure 101/66, pulse 95, temperature 97.6 F (36.4 C), temperature source Oral, resp. rate 19, height 5\' 4"  (1.626 m), weight 83.5 kg, SpO2 97 %. Physical Examination: CONSTITUTIONAL: Well-developed, well-nourished female in no acute distress.  HENT:  Normocephalic, atraumatic, External right and left ear normal.  EYES: Conjunctivae and EOM are normal. Pupils are equal, round, and reactive to light. No scleral icterus.  NECK: Normal range of motion, supple, no masses SKIN: Skin is warm and dry. No rash noted. Not diaphoretic. No erythema. No pallor. NEUROLGIC: Alert and oriented to person, place, and time. Normal reflexes, muscle tone coordination. No cranial nerve deficit noted. PSYCHIATRIC: Normal mood and affect. Normal behavior. Normal judgment and thought content. CARDIOVASCULAR: Normal heart rate noted, regular rhythm RESPIRATORY: Effort and breath sounds normal, no problems with respiration noted while lying in bed MUSCULOSKELETAL: Normal range of motion. No edema and no tenderness. 2+ distal pulses. ABDOMEN: Soft, nontender, nondistended, gravid. CERVIX:  Deferred  Fetal monitoring: FHR: 130 bpm, Variability: moderate, Accelerations: Present, Decelerations: Absent  Uterine activity: No contractions   Results for orders placed or performed during the hospital encounter of 04/06/20 (from the past 48 hour(s))  Urinalysis, Routine w reflex microscopic Urine, Clean Catch     Status: Abnormal   Collection Time:  04/06/20  6:01 PM  Result Value Ref Range   Color, Urine YELLOW YELLOW   APPearance HAZY (A) CLEAR   Specific Gravity, Urine 1.005 1.005 - 1.030   pH 6.0 5.0 - 8.0   Glucose, UA NEGATIVE NEGATIVE mg/dL   Hgb urine dipstick NEGATIVE NEGATIVE   Bilirubin Urine NEGATIVE NEGATIVE   Ketones, ur 20 (A) NEGATIVE mg/dL   Protein, ur NEGATIVE NEGATIVE mg/dL   Nitrite NEGATIVE NEGATIVE   Leukocytes,Ua LARGE (A) NEGATIVE   WBC, UA 21-50 0 - 5 WBC/hpf   Bacteria, UA MANY (A) NONE SEEN   Squamous Epithelial / LPF 6-10 0 - 5   Mucus PRESENT    Amorphous Crystal PRESENT     Comment: Performed at [facility], [address] [place], [place] [address]  CBC with Differential/Platelet     Status: Abnormal   Collection Time: 04/06/20  6:19 PM  Result Value Ref Range   WBC 3.6 (L) 4.0 - 10.5 K/uL   RBC 5.70 (H) 3.87 - 5.11 MIL/uL   Hemoglobin 13.1 12.0 - 15.0 g/dL   HCT 60.443.1 36 - 46 %   MCV 75.6 (L) 80.0 - 100.0 fL   MCH 23.0 (L) 26.0 - 34.0 pg   MCHC 30.4 30.0 - 36.0 g/dL   RDW 54.015.7 (H) 98.111.5 - 19.115.5 %   Platelets 123 (L) 150 - 400 K/uL    Comment: REPEATED TO VERIFY   nRBC 0.0 0.0 - 0.2 %   Neutrophils Relative % 73 %   Neutro Abs 2.5 1.7 - 7.7 K/uL   Lymphocytes Relative 19 %   Lymphs Abs 0.7 0.7 - 4.0 K/uL   Monocytes Relative 5 %   Monocytes Absolute 0.2 0 - 1 K/uL  Eosinophils Relative 0 %   Eosinophils Absolute 0.0 0 - 0 K/uL   Basophils Relative 1 %   Basophils Absolute 0.0 0 - 0 K/uL   Immature Granulocytes 2 %   Abs Immature Granulocytes 0.08 (H) 0.00 - 0.07 K/uL    Comment: Performed at [facility], [address], [place], [place] [address]  Brain natriuretic peptide     Status: None   Collection Time: 04/06/20  6:19 PM  Result Value Ref Range   B Natriuretic Peptide 36.5 0.0 - 100.0 pg/mL    Comment: Performed at [facility], [address] [place], [place] [address]  Comprehensive metabolic panel     Status: Abnormal   Collection Time: 04/06/20  6:19  PM  Result Value Ref Range   Sodium 136 135 - 145 mmol/L   Potassium 3.8 3.5 - 5.1 mmol/L   Chloride 108 98 - 111 mmol/L   CO2 13 (L) 22 - 32 mmol/L   Glucose, Bld 84 70 - 99 mg/dL    Comment: Glucose reference range applies only to samples taken after fasting for at least 8 hours.   BUN 10 6 - 20 mg/dL   Creatinine, Ser 5.63 0.44 - 1.00 mg/dL   Calcium 8.4 (L) 8.9 - 10.3 mg/dL   Total Protein 6.4 (L) 6.5 - 8.1 g/dL   Albumin 2.4 (L) 3.5 - 5.0 g/dL   AST 41 15 - 41 U/L   ALT 22 0 - 44 U/L   Alkaline Phosphatase 262 (H) 38 - 126 U/L   Total Bilirubin 0.8 0.3 - 1.2 mg/dL   GFR calc non Af Amer >60 >60 mL/min   GFR calc Af Amer >60 >60 mL/min   Anion gap 15 5 - 15    Comment: Performed at [facility], [address], [place], [place] [address]  Troponin I (High Sensitivity)     Status: None   Collection Time: 04/06/20  6:19 PM  Result Value Ref Range   Troponin I (High Sensitivity) 9 <18 ng/L    Comment: (NOTE) Elevated high sensitivity troponin I (hsTnI) values and significant  changes across serial measurements may suggest ACS but many other  chronic and acute conditions are known to elevate hsTnI results.  Refer to the "Links" section for chest pain algorithms and additional  guidance. Performed at [facility], [address], [place], [place] [address]   Type and screen [facility]     Status: None   Collection Time: 04/06/20  6:30 PM  Result Value Ref Range   ABO/RH(D) O POS    Antibody Screen NEG    Sample Expiration      04/09/2020,2359 Performed at [facility], [address] [place], [place] [address]   D-dimer, quantitative (not at [facility])     Status: Abnormal   Collection Time: 04/06/20  7:42 PM  Result Value Ref Range   D-Dimer, Quant 5.46 (H) 0.00 - 0.50 ug/mL-FEU    Comment: (NOTE) At the manufacturer cut-off of 0.50 ug/mL FEU, this assay has been documented to exclude PE with a sensitivity and negative predictive value  of 97 to 99%.  At this time, this assay has not been approved by the FDA to exclude DVT/VTE. Results should be correlated with clinical presentation. Performed at [facility], [address] [place], [place] [address]   C-reactive protein     Status: Abnormal   Collection Time: 04/06/20  7:42 PM  Result Value Ref Range   CRP 7.1 (H) <1.0 mg/dL    Comment: Performed at [facility], [address] [place], [place] [address]  Troponin I (High Sensitivity)     Status: None   Collection Time: 04/06/20  8:01 PM  Result Value Ref Range   Troponin I (High Sensitivity) 9 <18 ng/L    Comment: (NOTE) Elevated high sensitivity troponin I (hsTnI) values and significant  changes across serial measurements may suggest ACS but many other  chronic and acute conditions are known to elevate hsTnI results.  Refer to the "Links" section for chest pain algorithms and additional  guidance. Performed at [facility], [address] [place], [place] [address]   SARS Coronavirus 2 by RT PCR (hospital order, performed in [facility] hospital lab) Nasopharyngeal Nasopharyngeal Swab     Status: Abnormal   Collection Time: 04/06/20  9:30 PM   Specimen: Nasopharyngeal Swab  Result Value Ref Range   SARS Coronavirus 2 POSITIVE (A) NEGATIVE    Comment: RESULT CALLED TO, READ BACK BY AND VERIFIED WITH: C,JONES RN @[phone]  04/06/20 EB (NOTE) SARS-CoV-2 target nucleic acids are DETECTED  SARS-CoV-2 RNA is generally detectable in upper respiratory specimens  during the acute phase of infection.  Positive results are indicative  of the presence of the identified virus, but do not rule out bacterial infection or co-infection with other pathogens not detected by the test.  Clinical correlation with patient history and  other diagnostic information is necessary to determine patient infection status.  The expected result is negative.  Fact Sheet for Patients:    06/06/20   Fact Sheet for Healthcare Providers:   BoilerBrush.com.cy    This test is not yet approved or cleared by the https://pope.com/ FDA and  has been authorized for detection and/or diagnosis of SARS-CoV-2 by FDA under an Emergency Use Authorization (EUA).  This EUA will remain in effect (meaning this test can b e used) for the duration of  the COVID-19 declaration under Section 564(b)(1) of the Act, 21 U.S.C. section 360-bbb-3(b)(1), unless the authorization is terminated or revoked sooner.  Performed at [facility], [address], [place], [place]   CBC with Differential/Platelet     Status: Abnormal   Collection Time: 04/07/20  5:19 AM  Result Value Ref Range   WBC 5.2 4.0 - 10.5 K/uL   RBC 5.31 (H) 3.87 - 5.11 MIL/uL   Hemoglobin 12.4 12.0 - 15.0 g/dL   HCT 06/07/20 36 - 46 %   MCV 76.1 (L) 80.0 - 100.0 fL   MCH 23.4 (L) 26.0 - 34.0 pg   MCHC 30.7 30.0 - 36.0 g/dL   RDW 78.4 69.6 - 29.5 %   Platelets 155 150 - 400 K/uL   nRBC 0.0 0.0 - 0.2 %   Neutrophils Relative % 84 %   Neutro Abs 4.4 1.7 - 7.7 K/uL   Lymphocytes Relative 11 %   Lymphs Abs 0.6 (L) 0.7 - 4.0 K/uL   Monocytes Relative 3 %   Monocytes Absolute 0.2 0 - 1 K/uL   Eosinophils Relative 0 %   Eosinophils Absolute 0.0 0 - 0 K/uL   Basophils Relative 0 %   Basophils Absolute 0.0 0 - 0 K/uL   Immature Granulocytes 2 %   Abs Immature Granulocytes 0.08 (H) 0.00 - 0.07 K/uL    Comment: Performed at [facility], [address] [place], [place]  Comprehensive metabolic panel     Status: Abnormal   Collection Time: 04/07/20  5:19 AM  Result Value Ref Range   Sodium 136 135 - 145 mmol/L   Potassium 3.9 3.5 - 5.1 mmol/L   Chloride 109 98 - 111 mmol/L   CO2 12 (L) 22 - 32 mmol/L   Glucose, Bld 95 70 - 99 mg/dL    Comment: Glucose reference range applies only to samples taken after fasting for at least 8 hours.   BUN 9 6 -  20 mg/dL   Creatinine, Ser 5.68 0.44 - 1.00 mg/dL   Calcium 8.4 (L) 8.9 - 10.3 mg/dL   Total Protein 5.8 (L) 6.5 - 8.1 g/dL   Albumin 2.3 (L) 3.5 - 5.0 g/dL   AST 35 15 - 41 U/L   ALT 19 0 - 44 U/L   Alkaline Phosphatase 245 (H) 38 - 126 U/L   Total Bilirubin 0.9 0.3 - 1.2 mg/dL   GFR calc non Af Amer >60 >60 mL/min   GFR calc Af Amer >60 >60 mL/min   Anion gap 15 5 - 15    Comment: Performed at [facility], [address], [place], [place] [address]  C-reactive protein     Status: Abnormal   Collection Time: 04/07/20  5:19 AM  Result Value Ref Range   CRP 6.0 (H) <1.0 mg/dL    Comment: Performed at [facility], [address], [place], [place] [address]  D-dimer, quantitative (not at [facility])     Status: Abnormal   Collection Time: 04/07/20  5:19 AM  Result Value Ref Range   D-Dimer, Quant 4.21 (H) 0.00 - 0.50 ug/mL-FEU    Comment: (NOTE) At the manufacturer cut-off of 0.50 ug/mL FEU, this assay has been documented to exclude PE with a sensitivity and negative predictive value of 97 to 99%.  At this time, this assay has not been approved by the FDA to exclude DVT/VTE. Results should be correlated with clinical presentation. Performed at [facility], [address] [place], [place] [address]     DG CHEST PORT 1 VIEW  Result Date: 04/06/2020 CLINICAL DATA:  Short of breath, dizziness and fatigue. Positive COVID-19 test last week. Thirty-six weeks pregnant. EXAM: PORTABLE CHEST 1 VIEW COMPARISON:  None. FINDINGS: Questionable subtle airspace opacities in the lower lungs. Lungs otherwise clear. No pleural effusion or pneumothorax. Normal heart, mediastinum and hila. Skeletal structures are unremarkable. IMPRESSION: 1. Possible subtle airspace opacities in the lower lungs. This is somewhat equivocal and would be better assessed with CT. Electronically Signed   By: Amie Portland M.D.   On: 04/06/2020 18:55    Current scheduled medications . docusate sodium  100 mg  Oral Daily  . methylPREDNISolone (SOLU-MEDROL) injection  0.5 mg/kg Intravenous Q12H   Followed by  . [START ON 04/10/2020] predniSONE  50 mg Oral Daily  . prenatal multivitamin  1 tablet Oral Q1200   . lactated ringers 125 mL/hr at 04/06/20 2005  . remdesivir 100 mg in NS 100 mL     I have reviewed the patient's current medications.  ASSESSMENT: Principal Problem:   Acute respiratory disease due to COVID-19 virus Active Problems:   COVID-19 affecting pregnancy in third trimester   [redacted] weeks gestation of pregnancy   PLAN: Still endorses breathing difficulty with exertion, but no oxygen requirement which is reassuring Continue incentive spirometry, Remdesivir, Solu-Medrol. Appreciate TRH co-management. Category 1 FHR tracing, continue daily NST Stable CBGs Continue routine  antenatal care.   Jaynie Collins, MD, FACOG Obstetrician & Gynecologist, [facility] for [employer], [facility]

## 2020-04-07 NOTE — H&P (Addendum)
FACULTY PRACTICE ANTEPARTUM ADMISSION HISTORY AND PHYSICAL NOTE   History of Present Illness: Sandra Caldwell is a 29 y.o. G1P0 at [redacted]w[redacted]d with EDC 05/05/2020, dating by LMP and c/w ultrasound at 9 weeks, admitted for covid -19 positive status, shortness of breath, tachypnea and chills.  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  She receives her prenatal care at [facility] in [place].  She states her [facility] has been complicated by diet controlled gestational diabetes asthma and  Alpha thalassemia trait. Per the pt her spouse had covid-19 infection first and likely passed it on to her.  Neither she or her spouse were vaccinated.  She was initially diagnosed on 03/31/2020 but has not received any further care to this date.  She was seen at [facility] on 8/31, but was discharged at that time. Patient reports the fetal movement as active. Patient reports uterine contraction  activity as none. Patient reports  vaginal bleeding as none. Patient describes fluid per vagina as None. Fetal presentation is unsure.  Patient Active Problem List   Diagnosis Date Noted  . Shortness of breath 04/06/2020  . Acute respiratory disease due to COVID-19 virus 04/06/2020    Past Medical History:  Diagnosis Date  . Anxiety   . Asthma   . Thalassemia minor     History reviewed. No pertinent surgical history.  OB History  Gravida Para Term Preterm AB Living  1            SAB TAB Ectopic Multiple Live Births               # Outcome Date GA Lbr Len/2nd Weight Sex Delivery Anes PTL Lv  1 Current             Social History   Socioeconomic History  . Marital status: Married    Spouse name: Not on file  . Number of children: Not on file  . Years of education: Not on file  . Highest education level: Not on file  Occupational History  . Not on file  Tobacco Use  . Smoking status: Never Smoker  . Smokeless tobacco: Never Used  Vaping Use  . Vaping Use: Never  used  Substance and Sexual Activity  . Alcohol use: Not Currently  . Drug use: Never  . Sexual activity: Yes  Other Topics Concern  . Not on file  Social History Narrative  . Not on file   Social Determinants of Health   Financial Resource Strain:   . Difficulty of Paying Living Expenses: Not on file  Food Insecurity:   . Worried About Programme researcher, broadcasting/film/video in the Last Year: Not on file  . Ran Out of Food in the Last Year: Not on file  Transportation Needs:   . Lack of Transportation (Medical): Not on file  . Lack of Transportation (Non-Medical): Not on file  Physical Activity:   . Days of Exercise per Week: Not on file  . Minutes of Exercise per Session: Not on file  Stress:   . Feeling of Stress : Not on file  Social Connections:   . Frequency of Communication with Friends and Family: Not on file  . Frequency of Social Gatherings with Friends and Family: Not on file  . Attends Religious Services: Not on file  . Active Member of Clubs or Organizations: Not on file  . Attends Banker Meetings: Not on file  . Marital Status: Not on  file    History reviewed. No pertinent family history.  Allergies  Allergen Reactions  . Sulfa Antibiotics Nausea Only    Medications Prior to Admission  Medication Sig Dispense Refill Last Dose  . acetaminophen (TYLENOL) 500 MG tablet Take 500 mg by mouth every 6 (six) hours as needed.   04/06/2020 at Unknown time  . albuterol (VENTOLIN HFA) 108 (90 Base) MCG/ACT inhaler Inhale 2 puffs into the lungs every 6 (six) hours as needed for wheezing or shortness of breath.   04/05/2020 at Unknown time  . ondansetron (ZOFRAN) 4 MG tablet Take 4 mg by mouth every 8 (eight) hours as needed for nausea or vomiting.   Past Month at Unknown time  . Prenatal Vit-Fe Fumarate-FA (PRENATAL MULTIVITAMIN) TABS tablet Take 1 tablet by mouth daily at 12 noon.   04/06/2020 at Unknown time  . busPIRone (BUSPAR) 10 MG tablet Take 10 mg by mouth 3 (three) times  daily. Pt states up to 3 times a day as needed.       Review of Systems - History obtained from the patient General ROS: positive for  - chills, malaise and shortness of breath negative for - fever or weight loss Respiratory ROS: positive for - cough and shortness of breath Cardiovascular ROS: no chest pain or dyspnea on exertion Gastrointestinal ROS: positive for - blood in stools negative for - abdominal pain, constipation, diarrhea or nausea/vomiting Musculoskeletal ROS: body aches noted  Vitals:  BP 106/79 (BP Location: Right Arm)   Pulse (!) 123   Temp 98.3 F (36.8 C) (Oral)   Resp 18   Ht 5\' 4"  (1.626 m)   Wt 83.5 kg   SpO2 100%   BMI 31.58 kg/m  Physical Examination: CONSTITUTIONAL: Well-developed, well-nourished female in no acute distress.  Appears fatigued but stable  HENT:  Normocephalic, atraumatic, External right and left ear normal. Oropharynx is clear and moist EYES: Conjunctivae and EOM are normal. Pupils are equal, round, and reactive to light. No scleral icterus.  NECK: Normal range of motion, supple, no masses SKIN: Skin is warm and dry. No rash noted. Not diaphoretic. No erythema. No pallor. NEUROLGIC: Alert and oriented to person, place, and time. Normal reflexes, muscle tone coordination. No cranial nerve deficit noted. PSYCHIATRIC: Normal mood and affect. Normal behavior. Normal judgment and thought content. CARDIOVASCULAR: tachycardic heart rate noted, regular rhythm RESPIRATORY: Effort and breath sounds normal, no problems with respiration noted ABDOMEN: Soft, nontender, nondistended, gravid. MUSCULOSKELETAL: Normal range of motion. No edema and no tenderness. 2+ distal pulses.  Cervix: Not evaluated.  Membranes:intact Fetal Monitoring:Baseline: 150 bpm, Variability: Fair (1-6 bpm), Accelerations: Reactive and Decelerations: Absent Tocometer: irregular mild contractions noted  Labs:  Results for orders placed or performed during the hospital  encounter of 04/06/20 (from the past 24 hour(s))  Urinalysis, Routine w reflex microscopic Urine, Clean Catch   Collection Time: 04/06/20  6:01 PM  Result Value Ref Range   Color, Urine YELLOW YELLOW   APPearance HAZY (A) CLEAR   Specific Gravity, Urine 1.005 1.005 - 1.030   pH 6.0 5.0 - 8.0   Glucose, UA NEGATIVE NEGATIVE mg/dL   Hgb urine dipstick NEGATIVE NEGATIVE   Bilirubin Urine NEGATIVE NEGATIVE   Ketones, ur 20 (A) NEGATIVE mg/dL   Protein, ur NEGATIVE NEGATIVE mg/dL   Nitrite NEGATIVE NEGATIVE   Leukocytes,Ua LARGE (A) NEGATIVE   WBC, UA 21-50 0 - 5 WBC/hpf   Bacteria, UA MANY (A) NONE SEEN   Squamous Epithelial / LPF 6-10  0 - 5   Mucus PRESENT    Amorphous Crystal PRESENT   CBC with Differential/Platelet   Collection Time: 04/06/20  6:19 PM  Result Value Ref Range   WBC 3.6 (L) 4.0 - 10.5 K/uL   RBC 5.70 (H) 3.87 - 5.11 MIL/uL   Hemoglobin 13.1 12.0 - 15.0 g/dL   HCT 60.1 36 - 46 %   MCV 75.6 (L) 80.0 - 100.0 fL   MCH 23.0 (L) 26.0 - 34.0 pg   MCHC 30.4 30.0 - 36.0 g/dL   RDW 09.3 (H) 23.5 - 57.3 %   Platelets 123 (L) 150 - 400 K/uL   nRBC 0.0 0.0 - 0.2 %   Neutrophils Relative % 73 %   Neutro Abs 2.5 1.7 - 7.7 K/uL   Lymphocytes Relative 19 %   Lymphs Abs 0.7 0.7 - 4.0 K/uL   Monocytes Relative 5 %   Monocytes Absolute 0.2 0 - 1 K/uL   Eosinophils Relative 0 %   Eosinophils Absolute 0.0 0 - 0 K/uL   Basophils Relative 1 %   Basophils Absolute 0.0 0 - 0 K/uL   Immature Granulocytes 2 %   Abs Immature Granulocytes 0.08 (H) 0.00 - 0.07 K/uL  Brain natriuretic peptide   Collection Time: 04/06/20  6:19 PM  Result Value Ref Range   B Natriuretic Peptide 36.5 0.0 - 100.0 pg/mL  Comprehensive metabolic panel   Collection Time: 04/06/20  6:19 PM  Result Value Ref Range   Sodium 136 135 - 145 mmol/L   Potassium 3.8 3.5 - 5.1 mmol/L   Chloride 108 98 - 111 mmol/L   CO2 13 (L) 22 - 32 mmol/L   Glucose, Bld 84 70 - 99 mg/dL   BUN 10 6 - 20 mg/dL   Creatinine,  Ser 2.20 0.44 - 1.00 mg/dL   Calcium 8.4 (L) 8.9 - 10.3 mg/dL   Total Protein 6.4 (L) 6.5 - 8.1 g/dL   Albumin 2.4 (L) 3.5 - 5.0 g/dL   AST 41 15 - 41 U/L   ALT 22 0 - 44 U/L   Alkaline Phosphatase 262 (H) 38 - 126 U/L   Total Bilirubin 0.8 0.3 - 1.2 mg/dL   GFR calc non Af Amer >60 >60 mL/min   GFR calc Af Amer >60 >60 mL/min   Anion gap 15 5 - 15  Troponin I (High Sensitivity)   Collection Time: 04/06/20  6:19 PM  Result Value Ref Range   Troponin I (High Sensitivity) 9 <18 ng/L  Type and screen [facility]   Collection Time: 04/06/20  6:30 PM  Result Value Ref Range   ABO/RH(D) O POS    Antibody Screen NEG    Sample Expiration      04/09/2020,2359 Performed at [facility], [address], [place], [place] [address]   D-dimer, quantitative (not at [facility])   Collection Time: 04/06/20  7:42 PM  Result Value Ref Range   D-Dimer, Quant 5.46 (H) 0.00 - 0.50 ug/mL-FEU  C-reactive protein   Collection Time: 04/06/20  7:42 PM  Result Value Ref Range   CRP 7.1 (H) <1.0 mg/dL  Troponin I (High Sensitivity)   Collection Time: 04/06/20  8:01 PM  Result Value Ref Range   Troponin I (High Sensitivity) 9 <18 ng/L  SARS Coronavirus 2 by RT PCR (hospital order, performed in [facility] hospital lab) Nasopharyngeal Nasopharyngeal Swab   Collection Time: 04/06/20  9:30 PM   Specimen: Nasopharyngeal Swab  Result  Value Ref Range   SARS Coronavirus 2 POSITIVE (A) NEGATIVE    Imaging Studies: DG CHEST PORT 1 VIEW  Result Date: 04/06/2020 CLINICAL DATA:  Short of breath, dizziness and fatigue. Positive COVID-19 test last week. Thirty-six weeks pregnant. EXAM: PORTABLE CHEST 1 VIEW COMPARISON:  None. FINDINGS: Questionable subtle airspace opacities in the lower lungs. Lungs otherwise clear. No pleural effusion or pneumothorax. Normal heart, mediastinum and hila. Skeletal structures are unremarkable. IMPRESSION: 1. Possible subtle airspace opacities in the lower lungs. This  is somewhat equivocal and would be better assessed with CT. Electronically Signed   By: Amie Portland M.D.   On: 04/06/2020 18:55     Assessment and Plan: Patient Active Problem List   Diagnosis Date Noted  . Shortness of breath 04/06/2020  . Acute respiratory disease due to COVID-19 virus 04/06/2020   Admit to Antenatal 1. Albuterol inhaler PRN  2.  gestational diabetes: carb control diet, will check fasting and postprandial blood sugars  3. Covid -19 respiratory disease: will defer to medicine for treatment with remdesivir, otherwise provide supportive therapy. Appreciate medicine assistance.   Mariel Aloe, MD  Faculty Attending, Center for [facility]

## 2020-04-07 NOTE — Progress Notes (Signed)
PROGRESS NOTE  Sandra Caldwell YIA:[id number] DOB: 29-May-1991 DOA: 04/06/2020  PCP: Patient, No Pcp Per  Brief History/Interval Summary: 29 year old African-American female with a past medical history of asthma who is currently about [redacted] weeks pregnant presented with shortness of breath diarrhea fatigue dizziness.  Apparently her husband was diagnosed with COVID-19 recently.  She was hospitalized by OB/GYN service and we were consulted for management of COVID-19.  Reason for Visit: Pneumonia due to COVID-19   Antibiotics: Anti-infectives (From admission, onward)   Start     Dose/Rate Route Frequency Ordered Stop   04/07/20 2200  remdesivir 100 mg in sodium chloride 0.9 % 100 mL IVPB       "Followed by" Linked Group Details   100 mg 200 mL/hr over 30 Minutes Intravenous Daily 04/06/20 2308 04/11/20 2159   04/06/20 2315  remdesivir 200 mg in sodium chloride 0.9% 250 mL IVPB       "Followed by" Linked Group Details   200 mg 580 mL/hr over 30 Minutes Intravenous Once 04/06/20 2308 04/07/20 0125      Subjective/Interval History: Patient states that she is feeling better.  Denies any shortness of breath or chest pain currently.  Occasional cough.  Diarrhea appears to be subsiding.  Denies any abdominal pain.   Assessment/Plan:  Pneumonia due to COVID-19    Recent Labs  Lab 04/06/20 1819 04/06/20 1942 04/07/20 0519  DDIMER  --  5.46* 4.21*  CRP  --  7.1* 6.0*  ALT 22  --  19    Objective findings: Fever: Afebrile this morning Oxygen requirements: On room air saturating 96%  COVID 19 Therapeutics: Antibacterials: None Remdesivir: Day 2 Steroids: Solu-Medrol twice a day Diuretics: None Actemra/Baricitinib: None yet DVT Prophylaxis:  Lovenox 40 mg once a day will be initiated.  This was discussed with the OB attending, Dr. Macon Large.  From a respiratory standpoint patient seems to be stable.  She is not requiring any oxygen currently.  Respiratory symptoms have also  improved.  She remains on Remdesivir and steroids.  Continue with incentive spirometry as noted there is no contraindication from a pregnancy standpoint.  Mobilize as much as possible.  We will discuss DVT prophylaxis with OB.  D-dimer was noted to be elevated but seems to be improving.  Since she is otherwise improving and does not have any oxygen requirements have low suspicion for venous thromboembolic phenomenon.  Her diarrhea is most likely due to COVID-19 infection.  Seems to be improving.  Has not noted any rectal bleeding.   Noted to have metabolic acidosis which is most likely due to hypovolemia.  Should improve as diarrhea subsides.  Continue with IV fluids.  Platelet counts are noted to be normal this morning.  LFTs are normal.  Other issues per primary service.  Discussed with the OB attending, Dr. Macon Large.    Medications:  Scheduled: . docusate sodium  100 mg Oral Daily  . methylPREDNISolone (SOLU-MEDROL) injection  0.5 mg/kg Intravenous Q12H   Followed by  . [START ON 04/10/2020] predniSONE  50 mg Oral Daily  . prenatal multivitamin  1 tablet Oral Q1200   Continuous: . lactated ringers 125 mL/hr at 04/06/20 2005  . remdesivir 100 mg in NS 100 mL     LMB:[id number], albuterol, calcium carbonate, zolpidem   Objective:  Vital Signs  Vitals:   04/07/20 0129 04/07/20 0510 04/07/20 0800 04/07/20 1030  BP: 106/63 102/70 101/66   Pulse: (!) 127 91 95   Resp: (!) 24 (!) 22 19  Temp: 99.1 F (37.3 C) 98 F (36.7 C) 97.6 F (36.4 C)   TempSrc: Axillary Oral Oral   SpO2: 99% 98% 97% 96%  Weight:      Height:       No intake or output data in the 24 hours ending 04/07/20 1100 Filed Weights   04/06/20 2212  Weight: 83.5 kg    General appearance: Awake alert.  In no distress Resp: Normal effort.  Crackles bilateral bases.  No wheezing or rhonchi. Cardio: S1-S2 is normal regular.  No S3-S4.  No rubs murmurs or bruit   Lab Results:  Data Reviewed: I have  personally reviewed following labs and imaging studies  CBC: Recent Labs  Lab 04/06/20 1819 04/07/20 0519  WBC 3.6* 5.2  NEUTROABS 2.5 4.4  HGB 13.1 12.4  HCT 43.1 40.4  MCV 75.6* 76.1*  PLT 123* 155    Basic Metabolic Panel: Recent Labs  Lab 04/06/20 1819 04/07/20 0519  NA 136 136  K 3.8 3.9  CL 108 109  CO2 13* 12*  GLUCOSE 84 95  BUN 10 9  CREATININE 0.94 0.96  CALCIUM 8.4* 8.4*    GFR: Estimated Creatinine Clearance: 91.2 mL/min (by C-G formula based on SCr of 0.96 mg/dL).  Liver Function Tests: Recent Labs  Lab 04/06/20 1819 04/07/20 0519  AST 41 35  ALT 22 19  ALKPHOS 262* 245*  BILITOT 0.8 0.9  PROT 6.4* 5.8*  ALBUMIN 2.4* 2.3*     Recent Results (from the past 240 hour(s))  SARS Coronavirus 2 by RT PCR (hospital order, performed in [facility] hospital lab) Nasopharyngeal Nasopharyngeal Swab     Status: Abnormal   Collection Time: 04/06/20  9:30 PM   Specimen: Nasopharyngeal Swab  Result Value Ref Range Status   SARS Coronavirus 2 POSITIVE (A) NEGATIVE Final    Comment: RESULT CALLED TO, READ BACK BY AND VERIFIED WITH: C,JONES RN @2248  04/06/20 EB (NOTE) SARS-CoV-2 target nucleic acids are DETECTED  SARS-CoV-2 RNA is generally detectable in upper respiratory specimens  during the acute phase of infection.  Positive results are indicative  of the presence of the identified virus, but do not rule out bacterial infection or co-infection with other pathogens not detected by the test.  Clinical correlation with patient history and  other diagnostic information is necessary to determine patient infection status.  The expected result is negative.  Fact Sheet for Patients:   06/06/20   Fact Sheet for Healthcare Providers:   BoilerBrush.com.cy    This test is not yet approved or cleared by the https://pope.com/ FDA and  has been authorized for detection and/or diagnosis of SARS-CoV-2 by FDA  under an Emergency Use Authorization (EUA).  This EUA will remain in effect (meaning this test can b e used) for the duration of  the COVID-19 declaration under Section 564(b)(1) of the Act, 21 U.S.C. section 360-bbb-3(b)(1), unless the authorization is terminated or revoked sooner.  Performed at [facility], [address] [place], [place]       Radiology Studies: DG CHEST PORT 1 VIEW  Result Date: 04/06/2020 CLINICAL DATA:  Short of breath, dizziness and fatigue. Positive COVID-19 test last week. Thirty-six weeks pregnant. EXAM: PORTABLE CHEST 1 VIEW COMPARISON:  None. FINDINGS: Questionable subtle airspace opacities in the lower lungs. Lungs otherwise clear. No pleural effusion or pneumothorax. Normal heart, mediastinum and hila. Skeletal structures are unremarkable. IMPRESSION: 1. Possible subtle airspace opacities in the lower lungs. This is somewhat equivocal and  would be better assessed with CT. Electronically Signed   By: Amie Portland M.D.   On: 04/06/2020 18:55       LOS: 1 day   Sandra Caldwell  [facility] Pager on [email or web address]  04/07/2020, 11:00 AM

## 2020-04-08 DIAGNOSIS — Z3A36 36 weeks gestation of pregnancy: Secondary | ICD-10-CM

## 2020-04-08 LAB — GLUCOSE, CAPILLARY: Glucose-Capillary: 117 mg/dL — ABNORMAL HIGH (ref 70–99)

## 2020-04-08 LAB — CBC WITH DIFFERENTIAL/PLATELET
Abs Immature Granulocytes: 0.33 10*3/uL — ABNORMAL HIGH (ref 0.00–0.07)
Basophils Absolute: 0 10*3/uL (ref 0.0–0.1)
Basophils Relative: 0 %
Eosinophils Absolute: 0 10*3/uL (ref 0.0–0.5)
Eosinophils Relative: 0 %
HCT: 40.1 % (ref 36.0–46.0)
Hemoglobin: 11.8 g/dL — ABNORMAL LOW (ref 12.0–15.0)
Immature Granulocytes: 6 %
Lymphocytes Relative: 17 %
Lymphs Abs: 1 10*3/uL (ref 0.7–4.0)
MCH: 22.1 pg — ABNORMAL LOW (ref 26.0–34.0)
MCHC: 29.4 g/dL — ABNORMAL LOW (ref 30.0–36.0)
MCV: 75.1 fL — ABNORMAL LOW (ref 80.0–100.0)
Monocytes Absolute: 0.4 10*3/uL (ref 0.1–1.0)
Monocytes Relative: 7 %
Neutro Abs: 4.2 10*3/uL (ref 1.7–7.7)
Neutrophils Relative %: 70 %
Platelets: 182 10*3/uL (ref 150–400)
RBC: 5.34 MIL/uL — ABNORMAL HIGH (ref 3.87–5.11)
RDW: 15.5 % (ref 11.5–15.5)
WBC: 6 10*3/uL (ref 4.0–10.5)
nRBC: 0.7 % — ABNORMAL HIGH (ref 0.0–0.2)

## 2020-04-08 LAB — COMPREHENSIVE METABOLIC PANEL
ALT: 18 U/L (ref 0–44)
AST: 34 U/L (ref 15–41)
Albumin: 2.1 g/dL — ABNORMAL LOW (ref 3.5–5.0)
Alkaline Phosphatase: 225 U/L — ABNORMAL HIGH (ref 38–126)
Anion gap: 13 (ref 5–15)
BUN: 10 mg/dL (ref 6–20)
CO2: 16 mmol/L — ABNORMAL LOW (ref 22–32)
Calcium: 9.4 mg/dL (ref 8.9–10.3)
Chloride: 113 mmol/L — ABNORMAL HIGH (ref 98–111)
Creatinine, Ser: 0.77 mg/dL (ref 0.44–1.00)
GFR calc Af Amer: >60 mL/min (ref >60)
GFR calc non Af Amer: >60 mL/min (ref >60)
Glucose, Bld: 107 mg/dL — ABNORMAL HIGH (ref 70–99)
Potassium: 3.8 mmol/L (ref 3.5–5.1)
Sodium: 142 mmol/L (ref 135–145)
Total Bilirubin: 0.5 mg/dL (ref 0.3–1.2)
Total Protein: 5.6 g/dL — ABNORMAL LOW (ref 6.5–8.1)

## 2020-04-08 LAB — D-DIMER, QUANTITATIVE: D-Dimer, Quant: 2.7 ug/mL-FEU — ABNORMAL HIGH (ref 0.00–0.50)

## 2020-04-08 LAB — C-REACTIVE PROTEIN: CRP: 2.5 mg/dL — ABNORMAL HIGH (ref <1.0)

## 2020-04-08 MED ORDER — PREDNISONE 20 MG PO TABS: ORAL_TABLET | ORAL | 0 refills | Status: DC

## 2020-04-08 MED ORDER — SODIUM CHLORIDE 0.9 % IV SOLN
100.0000 mg | Freq: Every day | INTRAVENOUS | Status: AC
  Administered 2020-04-08: 100 mg via INTRAVENOUS
  Filled 2020-04-08: qty 20

## 2020-04-08 NOTE — Progress Notes (Signed)
[facility] PROGRESS NOTE    Progress Note  Sandra Caldwell  JSH:[id number] DOB: October 16, 1990 DOA: 04/06/2020 PCP: Patient, No Pcp Per     Brief Narrative:   Sandra Caldwell is an 29 y.o. female African-American female with no past medical history about 76 pregnant presents with shortness of breath diarrhea fatigue.  Apparently her husband was recently diagnosed with COVID-19  Assessment/Plan:   Principal Problem:   COVID-19 affecting pregnancy in third trimester Active Problems:   Acute respiratory disease due to COVID-19 virus   Shortness of breath   [redacted] weeks gestation of pregnancy From a respiratory standpoint she seems to be stable not requiring oxygen, respiratory symptoms have improved she was started on IV remdesivir and steroids.  As she has not required oxygen I would recommend tapering her steroids off quickly. There is no contraindication for incentive spirometry I will continue to encourage her incentive spirometry. Her D-dimer was slightly elevated but seems to be improving,, which is likely due to her SARS-CoV-2 infection. If she is being discharged today would recommend her continuing her IV remdesivir as an outpatient. Her diarrhea is most likely due to COVID-19 and seems to be improving.  And she was fluid resuscitated on admission. She was noted to have some mild metabolic acidosis due to hypovolemia which is improving with IV fluid hydration.    DVT prophylaxis: lovenox Family Communication:none Status is: Inpatient    Code Status:     Code Status Orders  (From admission, onward)         Start     Ordered   04/06/20 2001  Full code  Continuous        04/06/20 2001        Code Status History    This patient has a current code status but no historical code status.   Advance Care Planning Activity        IV Access:    Peripheral IV   Procedures and diagnostic studies:   DG CHEST PORT 1 VIEW  Result Date: 04/06/2020 CLINICAL DATA:   Short of breath, dizziness and fatigue. Positive COVID-19 test last week. Thirty-six weeks pregnant. EXAM: PORTABLE CHEST 1 VIEW COMPARISON:  None. FINDINGS: Questionable subtle airspace opacities in the lower lungs. Lungs otherwise clear. No pleural effusion or pneumothorax. Normal heart, mediastinum and hila. Skeletal structures are unremarkable. IMPRESSION: 1. Possible subtle airspace opacities in the lower lungs. This is somewhat equivocal and would be better assessed with CT. Electronically Signed   By: Amie Portland M.D.   On: 04/06/2020 18:55     Medical Consultants:    None.  Anti-Infectives:   Remdesivir  Subjective:    Sandra Caldwell no complains  Objective:    Vitals:   04/07/20 2305 04/08/20 0442 04/08/20 0802 04/08/20 1143  BP: 108/69 108/71 107/71 109/71  Pulse: 93 96 (!) 102 99  Resp: (!) 24 (!) 22 20 (!) 21  Temp: 97.6 F (36.4 C) 97.6 F (36.4 C) (!) 97.5 F (36.4 C) (!) 97.5 F (36.4 C)  TempSrc: Oral Oral Oral Oral  SpO2: 96% 95% 95% 94%  Weight:      Height:       SpO2: 94 %   Intake/Output Summary (Last 24 hours) at 04/08/2020 1323 Last data filed at 04/08/2020 0600 Gross per 24 hour  Intake 4260 ml  Output 3350 ml  Net 910 ml   Filed Weights   04/06/20 2212  Weight: 83.5 kg    Exam: General exam: In no  acute distress. Respiratory system: Good air movement and clear to auscultation. Cardiovascular system: S1 & S2 heard, RRR. No JVD. Gastrointestinal system: Abdomen is nondistended, soft and nontender. gestetional abdomen. Extremities: No pedal edema. Skin: No rashes, lesions or ulcers Psychiatry: Judgement and insight appear normal. Mood & affect appropriate.    Data Reviewed:    Labs: Basic Metabolic Panel: Recent Labs  Lab 04/06/20 1819 04/06/20 1819 04/07/20 0519 04/08/20 0652  NA 136  --  136 142  K 3.8   < > 3.9 3.8  CL 108  --  109 113*  CO2 13*  --  12* 16*  GLUCOSE 84  --  95 107*  BUN 10  --  9 10  CREATININE 0.94   --  0.96 0.77  CALCIUM 8.4*  --  8.4* 9.4   < > = values in this interval not displayed.   GFR Estimated Creatinine Clearance: 109.4 mL/min (by C-G formula based on SCr of 0.77 mg/dL). Liver Function Tests: Recent Labs  Lab 04/06/20 1819 04/07/20 0519 04/08/20 0652  AST 41 35 34  ALT 22 19 18   ALKPHOS 262* 245* 225*  BILITOT 0.8 0.9 0.5  PROT 6.4* 5.8* 5.6*  ALBUMIN 2.4* 2.3* 2.1*   No results for input(s): LIPASE, AMYLASE in the last 168 hours. No results for input(s): AMMONIA in the last 168 hours. Coagulation profile No results for input(s): INR, PROTIME in the last 168 hours. COVID-19 Labs  Recent Labs    04/06/20 1942 04/07/20 0519 04/08/20 0652  DDIMER 5.46* 4.21* 2.70*  CRP 7.1* 6.0* 2.5*    Lab Results  Component Value Date   SARSCOV2NAA POSITIVE (A) 04/06/2020    CBC: Recent Labs  Lab 04/06/20 1819 04/07/20 0519 04/08/20 0652  WBC 3.6* 5.2 6.0  NEUTROABS 2.5 4.4 4.2  HGB 13.1 12.4 11.8*  HCT 43.1 40.4 40.1  MCV 75.6* 76.1* 75.1*  PLT 123* 155 182   Cardiac Enzymes: No results for input(s): CKTOTAL, CKMB, CKMBINDEX, TROPONINI in the last 168 hours. BNP (last 3 results) No results for input(s): PROBNP in the last 8760 hours. CBG: Recent Labs  Lab 04/07/20 1446 04/07/20 2253 04/08/20 1135  GLUCAP 133* 107* 117*   D-Dimer: Recent Labs    04/07/20 0519 04/08/20 0652  DDIMER 4.21* 2.70*   Hgb A1c: No results for input(s): HGBA1C in the last 72 hours. Lipid Profile: No results for input(s): CHOL, HDL, LDLCALC, TRIG, CHOLHDL, LDLDIRECT in the last 72 hours. Thyroid function studies: No results for input(s): TSH, T4TOTAL, T3FREE, THYROIDAB in the last 72 hours.  Invalid input(s): FREET3 Anemia work up: No results for input(s): VITAMINB12, FOLATE, FERRITIN, TIBC, IRON, RETICCTPCT in the last 72 hours. Sepsis Labs: Recent Labs  Lab 04/06/20 1819 04/07/20 0519 04/08/20 0652  WBC 3.6* 5.2 6.0   Microbiology Recent Results (from the  past 240 hour(s))  SARS Coronavirus 2 by RT PCR (hospital order, performed in [facility] hospital lab) Nasopharyngeal Nasopharyngeal Swab     Status: Abnormal   Collection Time: 04/06/20  9:30 PM   Specimen: Nasopharyngeal Swab  Result Value Ref Range Status   SARS Coronavirus 2 POSITIVE (A) NEGATIVE Final    Comment: RESULT CALLED TO, READ BACK BY AND VERIFIED WITH: C,JONES RN @2248  04/06/20 EB (NOTE) SARS-CoV-2 target nucleic acids are DETECTED  SARS-CoV-2 RNA is generally detectable in upper respiratory specimens  during the acute phase of infection.  Positive results are indicative  of the presence of the identified virus, but do not  rule out bacterial infection or co-infection with other pathogens not detected by the test.  Clinical correlation with patient history and  other diagnostic information is necessary to determine patient infection status.  The expected result is negative.  Fact Sheet for Patients:   BoilerBrush.com.cy   Fact Sheet for Healthcare Providers:   https://pope.com/    This test is not yet approved or cleared by the [place] FDA and  has been authorized for detection and/or diagnosis of SARS-CoV-2 by FDA under an Emergency Use Authorization (EUA).  This EUA will remain in effect (meaning this test can b e used) for the duration of  the COVID-19 declaration under Section 564(b)(1) of the Act, 21 U.S.C. section 360-bbb-3(b)(1), unless the authorization is terminated or revoked sooner.  Performed at [facility], [address] [place], [place] [address]      Medications:   . docusate sodium  100 mg Oral Daily  . enoxaparin (LOVENOX) injection  40 mg Subcutaneous Q24H  . methylPREDNISolone (SOLU-MEDROL) injection  0.5 mg/kg Intravenous Q12H   Followed by  . [START ON 04/10/2020] predniSONE  50 mg Oral Daily  . prenatal multivitamin  1 tablet Oral Q1200   Continuous Infusions: . lactated  ringers 100 mL/hr at 04/08/20 0622  . remdesivir 100 mg in NS 100 mL        LOS: 2 days   Marinda Elk  [facility]  04/08/2020, 1:23 PM

## 2020-04-08 NOTE — Discharge Instructions (Addendum)
Patient scheduled for outpatient Remdesivir infusions at 11am on Thursday 9/9 and Tim[place] 9/10 at [facility]. Please inform the patient to park at [address] [place], [place], as staff will be escorting the patient through the east entrance of the hospital.    There is a wave flag banner located near the entrance on [address]. Turn into this entrance and immediately turn left and park in 1 of the 5 designated Covid Infusion Parking spots. There is a phone number on the sign, please call and let the staff know what spot you are in and we will come out and get you. For questions call [phone].  Thanks.     10 Things You Can Do to Manage Your COVID-19 Symptoms at Home If you have possible or confirmed COVID-19: 1. Stay home from work and school. And stay away from other public places. If you must go out, avoid using any kind of public transportation, ridesharing, or taxis. 2. Monitor your symptoms carefully. If your symptoms get worse, call your healthcare provider immediately. 3. Get rest and stay hydrated. 4. If you have a medical appointment, call the healthcare provider ahead of time and tell them that you have or may have COVID-19. 5. For medical emergencies, call 911 and notify the dispatch personnel that you have or may have COVID-19. 6. Cover your cough and sneezes with a tissue or use the inside of your elbow. 7. Wash your hands often with soap and water for at least 20 seconds or clean your hands with an alcohol-based hand sanitizer that contains at least 60% alcohol. 8. As much as possible, stay in a specific room and away from other people in your home. Also, you should use a separate bathroom, if available. If you need to be around other people in or outside of the home, wear a mask. 9. Avoid sharing personal items with other people in your household, like dishes, towels, and bedding. 10. Clean all surfaces that are touched often, like counters, tabletops, and doorknobs. Use  household cleaning sprays or wipes according to the label instructions. [employer].co.uk 01/30/2019 This information is not intended to replace advice given to you by your health care provider. Make sure you discuss any questions you have with your health care provider. Document Revised: 07/04/2019 Document Reviewed: 07/04/2019 Elsevier Patient Education  2020 [employer].    Preventing Preterm Birth Preterm birth is when your baby is delivered between 20 weeks and 37 weeks of pregnancy. A full-term pregnancy lasts for at least 37 weeks. Preterm birth can be dangerous for your baby because the last few weeks of pregnancy are an important time for your baby's brain and lungs to grow. Many things can cause a baby to be born early. Sometimes the cause is not known. There are certain factors that make you more likely to experience preterm birth, such as:  Having a previous baby born preterm.  Being pregnant with twins or other multiples.  Having had fertility treatment.  Being overweight or underweight at the start of your pregnancy.  Having any of the following during pregnancy: ? An infection, including a urinary tract infection (UTI) or an STI (sexually transmitted infection). ? High blood pressure. ? Diabetes. ? Vaginal bleeding.  Being age 41 or older.  Being age 71 or younger.  Getting pregnant within 6 months of a previous pregnancy.  Suffering extreme stress or physical or emotional abuse during pregnancy.  Standing for long periods of time during pregnancy, such as  working at a job that requires standing. What are the risks? The most serious risk of preterm birth is that the baby may not survive. This is more likely to happen if a baby is born before 34 weeks. Other risks and complications of preterm birth may include your baby having:  Breathing problems.  Brain damage that affects movement and coordination (cerebral palsy).  Feeding difficulties.  Vision or  hearing problems.  Infections or inflammation of the digestive tract (colitis).  Developmental delays.  Learning disabilities.  Higher risk for diabetes, heart disease, and high blood pressure later in life. What can I do to lower my risk?  Medical care The most important thing you can do to lower your risk for preterm birth is to get routine medical care during pregnancy (prenatal care). If you have a high risk of preterm birth, you may be referred to a health care provider who specializes in managing high-risk pregnancies (perinatologist). You may be given medicine to help prevent preterm birth. Lifestyle changes Certain lifestyle changes can also lower your risk of preterm birth:  Wait at least 6 months after a pregnancy to become pregnant again.  Try to plan pregnancy for when you are between 17 and 60 years old.  Get to a healthy weight before getting pregnant. If you are overweight, work with your health care provider to safely lose weight.  Do not use any products that contain nicotine or tobacco, such as cigarettes and e-cigarettes. If you need help quitting, ask your health care provider.  Do not drink alcohol.  Do not use drugs. Where to find support For more support, consider:  Talking with your health care provider.  Talking with a therapist or substance abuse counselor, if you need help quitting.  Working with a diet and nutrition specialist (dietitian) or a Systems analyst to maintain a healthy weight.  Joining a support group. Where to find more information Learn more about preventing preterm birth from:  Centers for Disease Control and Prevention: http://curry.org/  March of Dimes: marchofdimes.org/complications/premature-babies.aspx  American Pregnancy Association: americanpregnancy.org/labor-and-birth/premature-labor Contact a health care provider if:  You have any of the following signs of preterm labor  before 37 weeks: ? A change or increase in vaginal discharge. ? Fluid leaking from your vagina. ? Pressure or cramps in your lower abdomen. ? A backache that does not go away or gets worse. ? Regular tightening (contractions) in your lower abdomen. Summary  Preterm birth means having your baby during weeks 20-37 of pregnancy.  Preterm birth may put your baby at risk for physical and mental problems.  Getting good prenatal care can help prevent preterm birth.  You can lower your risk of preterm birth by making certain lifestyle changes, such as not smoking and not using alcohol. This information is not intended to replace advice given to you by your health care provider. Make sure you discuss any questions you have with your health care provider. Document Revised: 06/30/2017 Document Reviewed: 03/26/2016 Elsevier Patient Education  2020 [employer].

## 2020-04-08 NOTE — Plan of Care (Signed)
  Problem: Health Behavior/Discharge Planning: Goal: Ability to manage health-related needs will improve Outcome: Adequate for Discharge   Problem: Clinical Measurements: Goal: Ability to maintain clinical measurements within normal limits will improve Outcome: Adequate for Discharge Goal: Will remain free from infection Outcome: Adequate for Discharge Goal: Diagnostic test results will improve Outcome: Adequate for Discharge Goal: Respiratory complications will improve Outcome: Adequate for Discharge Goal: Cardiovascular complication will be avoided Outcome: Adequate for Discharge   Problem: Activity: Goal: Risk for activity intolerance will decrease Outcome: Adequate for Discharge   Problem: Nutrition: Goal: Adequate nutrition will be maintained Outcome: Adequate for Discharge   Problem: Coping: Goal: Level of anxiety will decrease Outcome: Adequate for Discharge   Problem: Elimination: Goal: Will not experience complications related to bowel motility Outcome: Adequate for Discharge Goal: Will not experience complications related to urinary retention Outcome: Adequate for Discharge   Problem: Pain Managment: Goal: General experience of comfort will improve Outcome: Adequate for Discharge   Problem: Safety: Goal: Ability to remain free from injury will improve Outcome: Adequate for Discharge   Problem: Skin Integrity: Goal: Risk for impaired skin integrity will decrease Outcome: Adequate for Discharge   Problem: Education: Goal: Knowledge of disease or condition will improve Outcome: Adequate for Discharge Goal: Knowledge of the prescribed therapeutic regimen will improve Outcome: Adequate for Discharge Goal: Individualized Educational Video(s) Outcome: Adequate for Discharge   Problem: Clinical Measurements: Goal: Complications related to the disease process, condition or treatment will be avoided or minimized Outcome: Adequate for Discharge   Problem:  Education: Goal: Knowledge of risk factors and measures for prevention of condition will improve Outcome: Adequate for Discharge   Problem: Coping: Goal: Psychosocial and spiritual needs will be supported Outcome: Adequate for Discharge   Problem: Respiratory: Goal: Will maintain a patent airway Outcome: Adequate for Discharge Goal: Complications related to the disease process, condition or treatment will be avoided or minimized Outcome: Adequate for Discharge

## 2020-04-08 NOTE — Discharge Summary (Signed)
Antenatal Physician Discharge Summary  Patient ID: Sandra Caldwell MRN: [id number] DOB/AGE: 01-12-1991 29 y.o.  Admit date: 04/06/2020 Discharge date: 04/08/2020  Admission and Discharge Diagnoses: Principal Problem:   COVID-19 affecting pregnancy in third trimester Active Problems:   Acute respiratory disease due to COVID-19 virus   Shortness of breath   [redacted] weeks gestation of pregnancy  Prenatal Procedures: NST  Consults: Internal Medicine [facility])  Hospital Course:  Sandra Caldwell is a 29 y.o. G1P0 with IUP at 7120w1d admitted for worsening shortness of breath in the setting of COVID infection. Did not require oxygen but was very dyspneic with exertion. No fevers. CXR showed possible patchy infiltrates.  TRH was consulted, they recommended Remdesivir and steroids.  Patient also received Lovenox for VTE prophylaxis and did very well with incentive spirometry.  Albuterol was given as needed.  She did not need any oxygen and her dyspnea improved. The decision was made to discharge her home and she will complete her Remdesivir course as outpatient, this was arranged for her.  Her fetal heart rate monitoring remained reassuring, and she had no signs/symptoms of preterm labor or other maternal-fetal concerns. 36 week pelvic cultures (GBS, GC/Chlam) done prior to discharge. She was deemed stable for discharge to home with outpatient follow up.  Discharge Exam: Temp:  [97.5 F (36.4 C)-97.8 F (36.6 C)] 97.5 F (36.4 C) (09/08 1143) Pulse Rate:  [93-102] 99 (09/08 1143) Resp:  [20-24] 21 (09/08 1143) BP: (107-112)/(69-80) 109/71 (09/08 1143) SpO2:  [94 %-97 %] 94 % (09/08 1143) Physical Examination: CONSTITUTIONAL: Well-developed, well-nourished female in no acute distress.  NECK: Normal range of motion, supple, no masses NEUROLGIC: Alert and oriented to person, place, and time. Normal reflexes, muscle tone coordination. No cranial nerve deficit noted. PSYCHIATRIC: Normal mood and affect. Normal  behavior. Normal judgment and thought content. CARDIOVASCULAR: Normal heart rate noted, regular rhythm RESPIRATORY: Effort and breath sounds normal, no problems with respiration noted MUSCULOSKELETAL: Normal range of motion. No edema and no tenderness. 2+ distal pulses. ABDOMEN: Soft, nontender, nondistended, gravid. CERVIX: Dilation: Closed Effacement (%): Thick Cervical Position: Posterior Station: -3 Presentation: Vertex Exam by:: Dr. Macon LargeAnyanwu (GBS, G/C completed )  Fetal monitoring: FHR: 135 bpm, Variability: moderate, Accelerations: Present, Decelerations: Absent  Uterine activity: No contractions  Significant Diagnostic Studies:  Results for orders placed or performed during the hospital encounter of 04/06/20 (from the past 168 hour(s))  Urinalysis, Routine w reflex microscopic Urine, Clean Catch   Collection Time: 04/06/20  6:01 PM  Result Value Ref Range   Color, Urine YELLOW YELLOW   APPearance HAZY (A) CLEAR   Specific Gravity, Urine 1.005 1.005 - 1.030   pH 6.0 5.0 - 8.0   Glucose, UA NEGATIVE NEGATIVE mg/dL   Hgb urine dipstick NEGATIVE NEGATIVE   Bilirubin Urine NEGATIVE NEGATIVE   Ketones, ur 20 (A) NEGATIVE mg/dL   Protein, ur NEGATIVE NEGATIVE mg/dL   Nitrite NEGATIVE NEGATIVE   Leukocytes,Ua LARGE (A) NEGATIVE   WBC, UA 21-50 0 - 5 WBC/hpf   Bacteria, UA MANY (A) NONE SEEN   Squamous Epithelial / LPF 6-10 0 - 5   Mucus PRESENT    Amorphous Crystal PRESENT   CBC with Differential/Platelet   Collection Time: 04/06/20  6:19 PM  Result Value Ref Range   WBC 3.6 (L) 4.0 - 10.5 K/uL   RBC 5.70 (H) 3.87 - 5.11 MIL/uL   Hemoglobin 13.1 12.0 - 15.0 g/dL   HCT 40.943.1 36 - 46 %   MCV 75.6 (L)  80.0 - 100.0 fL   MCH 23.0 (L) 26.0 - 34.0 pg   MCHC 30.4 30.0 - 36.0 g/dL   RDW 13.0 (H) 86.5 - 78.4 %   Platelets 123 (L) 150 - 400 K/uL   nRBC 0.0 0.0 - 0.2 %   Neutrophils Relative % 73 %   Neutro Abs 2.5 1.7 - 7.7 K/uL   Lymphocytes Relative 19 %   Lymphs Abs 0.7 0.7 -  4.0 K/uL   Monocytes Relative 5 %   Monocytes Absolute 0.2 0 - 1 K/uL   Eosinophils Relative 0 %   Eosinophils Absolute 0.0 0 - 0 K/uL   Basophils Relative 1 %   Basophils Absolute 0.0 0 - 0 K/uL   Immature Granulocytes 2 %   Abs Immature Granulocytes 0.08 (H) 0.00 - 0.07 K/uL  Brain natriuretic peptide   Collection Time: 04/06/20  6:19 PM  Result Value Ref Range   B Natriuretic Peptide 36.5 0.0 - 100.0 pg/mL  Comprehensive metabolic panel   Collection Time: 04/06/20  6:19 PM  Result Value Ref Range   Sodium 136 135 - 145 mmol/L   Potassium 3.8 3.5 - 5.1 mmol/L   Chloride 108 98 - 111 mmol/L   CO2 13 (L) 22 - 32 mmol/L   Glucose, Bld 84 70 - 99 mg/dL   BUN 10 6 - 20 mg/dL   Creatinine, Ser 6.96 0.44 - 1.00 mg/dL   Calcium 8.4 (L) 8.9 - 10.3 mg/dL   Total Protein 6.4 (L) 6.5 - 8.1 g/dL   Albumin 2.4 (L) 3.5 - 5.0 g/dL   AST 41 15 - 41 U/L   ALT 22 0 - 44 U/L   Alkaline Phosphatase 262 (H) 38 - 126 U/L   Total Bilirubin 0.8 0.3 - 1.2 mg/dL   GFR calc non Af Amer >60 >60 mL/min   GFR calc Af Amer >60 >60 mL/min   Anion gap 15 5 - 15  Troponin I (High Sensitivity)   Collection Time: 04/06/20  6:19 PM  Result Value Ref Range   Troponin I (High Sensitivity) 9 <18 ng/L  Type and screen [facility]   Collection Time: 04/06/20  6:30 PM  Result Value Ref Range   ABO/RH(D) O POS    Antibody Screen NEG    Sample Expiration      04/09/2020,2359 Performed at [facility], [address], [place], [place] [address]   D-dimer, quantitative (not at [facility])   Collection Time: 04/06/20  7:42 PM  Result Value Ref Range   D-Dimer, Quant 5.46 (H) 0.00 - 0.50 ug/mL-FEU  C-reactive protein   Collection Time: 04/06/20  7:42 PM  Result Value Ref Range   CRP 7.1 (H) <1.0 mg/dL  Troponin I (High Sensitivity)   Collection Time: 04/06/20  8:01 PM  Result Value Ref Range   Troponin I (High Sensitivity) 9 <18 ng/L  SARS Coronavirus 2 by RT PCR (hospital order, performed  in [facility] hospital lab) Nasopharyngeal Nasopharyngeal Swab   Collection Time: 04/06/20  9:30 PM   Specimen: Nasopharyngeal Swab  Result Value Ref Range   SARS Coronavirus 2 POSITIVE (A) NEGATIVE  HIV Antibody (routine testing w rflx)   Collection Time: 04/07/20  5:19 AM  Result Value Ref Range   HIV Screen 4th Generation wRfx Non Reactive Non Reactive  CBC with Differential/Platelet   Collection Time: 04/07/20  5:19 AM  Result Value Ref Range   WBC 5.2 4.0 - 10.5 K/uL  RBC 5.31 (H) 3.87 - 5.11 MIL/uL   Hemoglobin 12.4 12.0 - 15.0 g/dL   HCT 02.7 36 - 46 %   MCV 76.1 (L) 80.0 - 100.0 fL   MCH 23.4 (L) 26.0 - 34.0 pg   MCHC 30.7 30.0 - 36.0 g/dL   RDW 74.1 28.7 - 86.7 %   Platelets 155 150 - 400 K/uL   nRBC 0.0 0.0 - 0.2 %   Neutrophils Relative % 84 %   Neutro Abs 4.4 1.7 - 7.7 K/uL   Lymphocytes Relative 11 %   Lymphs Abs 0.6 (L) 0.7 - 4.0 K/uL   Monocytes Relative 3 %   Monocytes Absolute 0.2 0 - 1 K/uL   Eosinophils Relative 0 %   Eosinophils Absolute 0.0 0 - 0 K/uL   Basophils Relative 0 %   Basophils Absolute 0.0 0 - 0 K/uL   Immature Granulocytes 2 %   Abs Immature Granulocytes 0.08 (H) 0.00 - 0.07 K/uL  Comprehensive metabolic panel   Collection Time: 04/07/20  5:19 AM  Result Value Ref Range   Sodium 136 135 - 145 mmol/L   Potassium 3.9 3.5 - 5.1 mmol/L   Chloride 109 98 - 111 mmol/L   CO2 12 (L) 22 - 32 mmol/L   Glucose, Bld 95 70 - 99 mg/dL   BUN 9 6 - 20 mg/dL   Creatinine, Ser 6.72 0.44 - 1.00 mg/dL   Calcium 8.4 (L) 8.9 - 10.3 mg/dL   Total Protein 5.8 (L) 6.5 - 8.1 g/dL   Albumin 2.3 (L) 3.5 - 5.0 g/dL   AST 35 15 - 41 U/L   ALT 19 0 - 44 U/L   Alkaline Phosphatase 245 (H) 38 - 126 U/L   Total Bilirubin 0.9 0.3 - 1.2 mg/dL   GFR calc non Af Amer >60 >60 mL/min   GFR calc Af Amer >60 >60 mL/min   Anion gap 15 5 - 15  C-reactive protein   Collection Time: 04/07/20  5:19 AM  Result Value Ref Range   CRP 6.0 (H) <1.0 mg/dL  D-dimer,  quantitative (not at [facility])   Collection Time: 04/07/20  5:19 AM  Result Value Ref Range   D-Dimer, Quant 4.21 (H) 0.00 - 0.50 ug/mL-FEU  Glucose, capillary   Collection Time: 04/07/20  2:46 PM  Result Value Ref Range   Glucose-Capillary 133 (H) 70 - 99 mg/dL  Glucose, capillary   Collection Time: 04/07/20 10:53 PM  Result Value Ref Range   Glucose-Capillary 107 (H) 70 - 99 mg/dL  CBC with Differential/Platelet   Collection Time: 04/08/20  6:52 AM  Result Value Ref Range   WBC 6.0 4.0 - 10.5 K/uL   RBC 5.34 (H) 3.87 - 5.11 MIL/uL   Hemoglobin 11.8 (L) 12.0 - 15.0 g/dL   HCT 09.4 36 - 46 %   MCV 75.1 (L) 80.0 - 100.0 fL   MCH 22.1 (L) 26.0 - 34.0 pg   MCHC 29.4 (L) 30.0 - 36.0 g/dL   RDW 70.9 62.8 - 36.6 %   Platelets 182 150 - 400 K/uL   nRBC 0.7 (H) 0.0 - 0.2 %   Neutrophils Relative % 70 %   Neutro Abs 4.2 1.7 - 7.7 K/uL   Lymphocytes Relative 17 %   Lymphs Abs 1.0 0.7 - 4.0 K/uL   Monocytes Relative 7 %   Monocytes Absolute 0.4 0 - 1 K/uL   Eosinophils Relative 0 %   Eosinophils Absolute 0.0 0 - 0 K/uL  Basophils Relative 0 %   Basophils Absolute 0.0 0 - 0 K/uL   WBC Morphology See Note    Immature Granulocytes 6 %   Abs Immature Granulocytes 0.33 (H) 0.00 - 0.07 K/uL  Comprehensive metabolic panel   Collection Time: 04/08/20  6:52 AM  Result Value Ref Range   Sodium 142 135 - 145 mmol/L   Potassium 3.8 3.5 - 5.1 mmol/L   Chloride 113 (H) 98 - 111 mmol/L   CO2 16 (L) 22 - 32 mmol/L   Glucose, Bld 107 (H) 70 - 99 mg/dL   BUN 10 6 - 20 mg/dL   Creatinine, Ser 0.08 0.44 - 1.00 mg/dL   Calcium 9.4 8.9 - 67.6 mg/dL   Total Protein 5.6 (L) 6.5 - 8.1 g/dL   Albumin 2.1 (L) 3.5 - 5.0 g/dL   AST 34 15 - 41 U/L   ALT 18 0 - 44 U/L   Alkaline Phosphatase 225 (H) 38 - 126 U/L   Total Bilirubin 0.5 0.3 - 1.2 mg/dL   GFR calc non Af Amer >60 >60 mL/min   GFR calc Af Amer >60 >60 mL/min   Anion gap 13 5 - 15  C-reactive protein   Collection Time: 04/08/20  6:52 AM   Result Value Ref Range   CRP 2.5 (H) <1.0 mg/dL  D-dimer, quantitative (not at [facility])   Collection Time: 04/08/20  6:52 AM  Result Value Ref Range   D-Dimer, Quant 2.70 (H) 0.00 - 0.50 ug/mL-FEU  Glucose, capillary   Collection Time: 04/08/20 11:35 AM  Result Value Ref Range   Glucose-Capillary 117 (H) 70 - 99 mg/dL   DG CHEST PORT 1 VIEW  Result Date: 04/06/2020 CLINICAL DATA:  Short of breath, dizziness and fatigue. Positive COVID-19 test last week. Thirty-six weeks pregnant. EXAM: PORTABLE CHEST 1 VIEW COMPARISON:  None. FINDINGS: Questionable subtle airspace opacities in the lower lungs. Lungs otherwise clear. No pleural effusion or pneumothorax. Normal heart, mediastinum and hila. Skeletal structures are unremarkable. IMPRESSION: 1. Possible subtle airspace opacities in the lower lungs. This is somewhat equivocal and would be better assessed with CT. Electronically Signed   By: Amie Portland M.D.   On: 04/06/2020 18:55    Future Appointments  Date Time Provider Department Center  04/09/2020 11:00 AM [id number] None  04/10/2020 11:00 AM [id number] None    Discharge Condition: Stable  Discharge disposition: 01-Home or Self Care       Discharge Instructions    Discharge activity:  No Restrictions   Complete by: As directed    Discharge diet:   Complete by: As directed    Diabetic diet   Fetal Kick Count:  Lie on our left side for one hour after a meal, and count the number of times your baby kicks.  If it is less than 5 times, get up, move around and drink some juice.  Repeat the test 30 minutes later.  If it is still less than 5 kicks in an hour, notify your doctor.   Complete by: As directed    MyChart COVID-19 home monitoring program   Complete by: Apr 08, 2020    Is the patient willing to use the MyChart Mobile App for home monitoring?: Yes   No sexual activity restrictions   Complete by: As directed    Notify physician for leaking of fluid   Complete by: As  directed    Notify physician for low, dull backache, unrelieved by heat or Tylenol  Complete by: As directed    Notify physician for pelvic pressure   Complete by: As directed    Notify physician for vaginal bleeding   Complete by: As directed    PRETERM LABOR:  Includes any of the follwing symptoms that occur between 20 - [redacted] weeks gestation.  If these symptoms are not stopped, preterm labor can result in preterm delivery, placing your baby at risk   Complete by: As directed      Allergies as of 04/08/2020      Reactions   Sulfa Antibiotics Nausea Only      Medication List    TAKE these medications   acetaminophen 500 MG tablet Commonly known as: TYLENOL Take 500 mg by mouth every 6 (six) hours as needed for mild pain, fever or headache.   albuterol 108 (90 Base) MCG/ACT inhaler Commonly known as: VENTOLIN HFA Inhale 2 puffs into the lungs every 6 (six) hours as needed for wheezing or shortness of breath.   albuterol 1.25 MG/3ML nebulizer solution Commonly known as: ACCUNEB Take 1 ampule by nebulization every 6 (six) hours as needed for wheezing.   benzonatate 200 MG capsule Commonly known as: TESSALON Take 200 mg by mouth 3 (three) times daily as needed for cough.   predniSONE 20 MG tablet Commonly known as: DELTASONE Take 2 tablets by mouth on 04/09/20 and 04/10/20, then 1 tablet on 04/11/20   pseudoephedrine-acetaminophen 30-500 MG Tabs tablet Commonly known as: TYLENOL SINUS Take 1 tablet by mouth every 4 (four) hours as needed (congestion).   VITAMIN C PO Take 1 tablet by mouth daily.   ZINC PO Take 1 tablet by mouth daily.       Follow-up Information    OB Provider at [facility]. Schedule an appointment as soon as possible for a visit in 2 week(s).   Why: Need follow up appointment             Discharge time: 45 minutes   Signed: Jaynie Collins M.D. 04/08/2020, 3:05 PM

## 2020-04-08 NOTE — Consult Note (Signed)
   [facility] Inpatient Consult   04/08/2020  Khaleah Reh 08/13/1990 [id number]   [facility] [[facility]]  Accountable Care Organization [ACO] Patient:  [facility] Plan    Patient is currently assigned to  [facility] Care Management RN for post hospital follow up support and care needs.  Patient will receive a post hospital call and will be evaluated for assessments and disease process education and furhter support.       Plan: Patient will be followed by [facility] RN Care Coordinator.   For additional questions or referrals please contact:   Charlesetta Shanks, RN BSN CCM [facility]  [phone] business mobile phone Toll free office [phone]  Fax number: [phone] [email or web address]

## 2020-04-08 NOTE — Progress Notes (Signed)
Patient scheduled for outpatient Remdesivir infusions at 11am on Thursday 9/9 and [place] 9/10 at [facility]. Please inform the patient to park at [address], , as staff will be escorting the patient through the east entrance of the hospital.    There is a wave flag banner located near the entrance on [address] Turn into this entrance and immediately turn left and park in 1 of the 5 designated Covid Infusion Parking spots. There is a phone number on the sign, please call and let the staff know what spot you are in and we will come out and get you. For questions call [phone].  Thanks.   

## 2020-04-09 ENCOUNTER — Ambulatory Visit (HOSPITAL_COMMUNITY)
Admit: 2020-04-09 | Discharge: 2020-04-09 | Disposition: A | Discharge status: Home / Self Care | Payer: 59 | Source: Ambulatory Visit | Attending: Pulmonary Disease | Admitting: Pulmonary Disease

## 2020-04-09 DIAGNOSIS — U071 COVID-19: Secondary | ICD-10-CM | POA: Diagnosis not present

## 2020-04-09 DIAGNOSIS — J1282 Pneumonia due to coronavirus disease 2019: Secondary | ICD-10-CM | POA: Diagnosis not present

## 2020-04-09 LAB — CULTURE, OB URINE: Culture: NO GROWTH

## 2020-04-09 LAB — GC/CHLAMYDIA PROBE AMP (~~LOC~~) NOT AT ARMC
Chlamydia: NEGATIVE
Comment: NEGATIVE
Comment: NORMAL
Neisseria Gonorrhea: NEGATIVE

## 2020-04-09 MED ORDER — ALBUTEROL SULFATE HFA 108 (90 BASE) MCG/ACT IN AERS: 2.0000 | INHALATION_SPRAY | Freq: Once | RESPIRATORY_TRACT | Status: DC | PRN

## 2020-04-09 MED ORDER — DIPHENHYDRAMINE HCL 50 MG/ML IJ SOLN: 50.0000 mg | Freq: Once | INTRAMUSCULAR | Status: DC | PRN

## 2020-04-09 MED ORDER — SODIUM CHLORIDE 0.9 % IV SOLN
100.0000 mg | Freq: Once | INTRAVENOUS | Status: AC
  Administered 2020-04-09: 100 mg via INTRAVENOUS
  Filled 2020-04-09: qty 20

## 2020-04-09 MED ORDER — METHYLPREDNISOLONE SODIUM SUCC 125 MG IJ SOLR: 125.0000 mg | Freq: Once | INTRAMUSCULAR | Status: DC | PRN

## 2020-04-09 MED ORDER — EPINEPHRINE 0.3 MG/0.3ML IJ SOAJ: 0.3000 mg | Freq: Once | INTRAMUSCULAR | Status: DC | PRN

## 2020-04-09 MED ORDER — SODIUM CHLORIDE 0.9 % IV SOLN: INTRAVENOUS | Status: DC | PRN

## 2020-04-09 MED ORDER — FAMOTIDINE IN NACL 20-0.9 MG/50ML-% IV SOLN: 20.0000 mg | Freq: Once | INTRAVENOUS | Status: DC | PRN

## 2020-04-09 NOTE — Discharge Instructions (Signed)
10 Things You Can Do to Manage Your COVID-19 Symptoms at Home If you have possible or confirmed COVID-19: 1. Stay home from work and school. And stay away from other public places. If you must go out, avoid using any kind of public transportation, ridesharing, or taxis. 2. Monitor your symptoms carefully. If your symptoms get worse, call your healthcare provider immediately. 3. Get rest and stay hydrated. 4. If you have a medical appointment, call the healthcare provider ahead of time and tell them that you have or may have COVID-19. 5. For medical emergencies, call 911 and notify the dispatch personnel that you have or may have COVID-19. 6. Cover your cough and sneezes with a tissue or use the inside of your elbow. 7. Wash your hands often with soap and water for at least 20 seconds or clean your hands with an alcohol-based hand sanitizer that contains at least 60% alcohol. 8. As much as possible, stay in a specific room and away from other people in your home. Also, you should use a separate bathroom, if available. If you need to be around other people in or outside of the home, wear a mask. 9. Avoid sharing personal items with other people in your household, like dishes, towels, and bedding. 10. Clean all surfaces that are touched often, like counters, tabletops, and doorknobs. Use household cleaning sprays or wipes according to the label instructions. cdc.gov/coronavirus 01/30/2019 This information is not intended to replace advice given to you by your health care provider. Make sure you discuss any questions you have with your health care provider. Document Revised: 07/04/2019 Document Reviewed: 07/04/2019 Elsevier Patient Education  2020 Elsevier Inc.  

## 2020-04-09 NOTE — Progress Notes (Signed)
  Diagnosis: COVID-19  Physician: Dr. Wright  Procedure: Covid Infusion Clinic Med: remdesivir infusion - Provided patient with remdesivir fact sheet for patients, parents and caregivers prior to infusion.  Complications: No immediate complications noted.  Discharge: Discharged home   Sandra Caldwell Marie C 04/09/2020  

## 2020-04-10 ENCOUNTER — Ambulatory Visit (HOSPITAL_COMMUNITY)
Admit: 2020-04-10 | Discharge: 2020-04-10 | Disposition: A | Discharge status: Home / Self Care | Payer: 59 | Attending: Pulmonary Disease | Admitting: Pulmonary Disease

## 2020-04-10 ENCOUNTER — Other Ambulatory Visit: Payer: Self-pay | Admitting: *Deleted

## 2020-04-10 DIAGNOSIS — J1282 Pneumonia due to coronavirus disease 2019: Secondary | ICD-10-CM | POA: Diagnosis not present

## 2020-04-10 DIAGNOSIS — U071 COVID-19: Secondary | ICD-10-CM | POA: Diagnosis not present

## 2020-04-10 LAB — CULTURE, BETA STREP (GROUP B ONLY)

## 2020-04-10 MED ORDER — EPINEPHRINE 0.3 MG/0.3ML IJ SOAJ: 0.3000 mg | Freq: Once | INTRAMUSCULAR | Status: DC | PRN

## 2020-04-10 MED ORDER — SODIUM CHLORIDE 0.9 % IV SOLN
100.0000 mg | Freq: Once | INTRAVENOUS | Status: AC
  Administered 2020-04-10: 100 mg via INTRAVENOUS
  Filled 2020-04-10: qty 20

## 2020-04-10 MED ORDER — ALBUTEROL SULFATE HFA 108 (90 BASE) MCG/ACT IN AERS: 2.0000 | INHALATION_SPRAY | Freq: Once | RESPIRATORY_TRACT | Status: DC | PRN

## 2020-04-10 MED ORDER — SODIUM CHLORIDE 0.9 % IV SOLN: INTRAVENOUS | Status: DC | PRN

## 2020-04-10 MED ORDER — METHYLPREDNISOLONE SODIUM SUCC 125 MG IJ SOLR: 125.0000 mg | Freq: Once | INTRAMUSCULAR | Status: DC | PRN

## 2020-04-10 MED ORDER — DIPHENHYDRAMINE HCL 50 MG/ML IJ SOLN: 50.0000 mg | Freq: Once | INTRAMUSCULAR | Status: DC | PRN

## 2020-04-10 MED ORDER — FAMOTIDINE IN NACL 20-0.9 MG/50ML-% IV SOLN: 20.0000 mg | Freq: Once | INTRAVENOUS | Status: DC | PRN

## 2020-04-10 NOTE — Progress Notes (Signed)
  Diagnosis: COVID-19  Physician:Dr Wright   Procedure: Covid Infusion Clinic Med: remdesivir infusion - Provided patient with remdesivir fact sheet for patients, parents and caregivers prior to infusion.  Complications: No immediate complications noted.  Discharge: Discharged home   Clydell Sposito W 04/10/2020  

## 2020-04-10 NOTE — Discharge Instructions (Signed)
10 Things You Can Do to Manage Your COVID-19 Symptoms at Home If you have possible or confirmed COVID-19: 1. Stay home from work and school. And stay away from other public places. If you must go out, avoid using any kind of public transportation, ridesharing, or taxis. 2. Monitor your symptoms carefully. If your symptoms get worse, call your healthcare provider immediately. 3. Get rest and stay hydrated. 4. If you have a medical appointment, call the healthcare provider ahead of time and tell them that you have or may have COVID-19. 5. For medical emergencies, call 911 and notify the dispatch personnel that you have or may have COVID-19. 6. Cover your cough and sneezes with a tissue or use the inside of your elbow. 7. Wash your hands often with soap and water for at least 20 seconds or clean your hands with an alcohol-based hand sanitizer that contains at least 60% alcohol. 8. As much as possible, stay in a specific room and away from other people in your home. Also, you should use a separate bathroom, if available. If you need to be around other people in or outside of the home, wear a mask. 9. Avoid sharing personal items with other people in your household, like dishes, towels, and bedding. 10. Clean all surfaces that are touched often, like counters, tabletops, and doorknobs. Use household cleaning sprays or wipes according to the label instructions. cdc.gov/coronavirus 01/30/2019 This information is not intended to replace advice given to you by your health care provider. Make sure you discuss any questions you have with your health care provider. Document Revised: 07/04/2019 Document Reviewed: 07/04/2019 Elsevier Patient Education  2020 Elsevier Inc.  

## 2020-04-10 NOTE — Patient Outreach (Signed)
[facility]) Care Management  04/10/2020  Sandra Caldwell August 15, 1990 [id number]   Transition of care telephone call  Referral received:04/07/20 Initial outreach:04/10/20 Insurance: UMR   Initial unsuccessful telephone call to patient's preferred number in order to complete transition of care assessment; no answer, left HIPAA compliant voicemail message requesting return call.   Objective: Per the electronic medical record, Sandra Caldwell  was hospitalized at [facility]  from 9/6-04/08/20 with/for Covid 19 pneumonia,Pregnancy in third trimester   . Comorbidities include: Diet controlled gestational diabetes, Positive covid test 03/31/20.  She was discharged to home on 04/08/20 without the need for home health services or durable medical equipment per the discharge summary.   Plan: This RNCM will route unsuccessful outreach letter with [facility] Care Management pamphlet and 24 hour Nurse Advice Line Magnet to [employer] Care Management clinical pool to be mailed to patient's home address. This RNCM will attempt another outreach within 4 business days.  Egbert Garibaldi, RN, BSN  [facility] Care Management,Care Management Coordinator  [phone]- Mobile [phone]- Toll Free Main Office

## 2020-04-12 DIAGNOSIS — U071 COVID-19: Secondary | ICD-10-CM | POA: Diagnosis not present

## 2020-04-12 DIAGNOSIS — Z3A37 37 weeks gestation of pregnancy: Secondary | ICD-10-CM | POA: Diagnosis not present

## 2020-04-12 DIAGNOSIS — O98513 Other viral diseases complicating pregnancy, third trimester: Secondary | ICD-10-CM | POA: Diagnosis not present

## 2020-04-12 DIAGNOSIS — R102 Pelvic and perineal pain: Secondary | ICD-10-CM | POA: Diagnosis not present

## 2020-04-12 DIAGNOSIS — Z3A36 36 weeks gestation of pregnancy: Secondary | ICD-10-CM | POA: Diagnosis not present

## 2020-04-12 DIAGNOSIS — O26893 Other specified pregnancy related conditions, third trimester: Secondary | ICD-10-CM | POA: Diagnosis not present

## 2020-04-14 DIAGNOSIS — Z3689 Encounter for other specified antenatal screening: Secondary | ICD-10-CM | POA: Diagnosis not present

## 2020-04-15 ENCOUNTER — Other Ambulatory Visit: Payer: Self-pay | Admitting: *Deleted

## 2020-04-15 ENCOUNTER — Encounter: Payer: Self-pay | Admitting: *Deleted

## 2020-04-15 NOTE — Patient Outreach (Signed)
[facility]) Care Management  04/15/2020  Sandra Caldwell [birth year] [id number]   Transition of care call/case closure   Referral received:04/07/20 Initial outreach:04/10/20 Insurance: UMR    Subjective: 2nd attempt  successful telephone call to patient's preferred number in order to complete transition of care assessment; 2 HIPAA identifiers verified. Explained purpose of call and completed transition of care assessment.  Sandra Caldwell states that she is feeling weak fatigued. She shortness of breath , cough fever. She voiced continuing to use incentive spirometry at at home. She reports appetite and intake has improved, reinforced rest and hydration. Sandra Caldwell discussed beng [redacted] weeks pregnant with visit to  OB on yesterday and baby was doing well. She reports that her blood sugars are under good control with being at goal of less than 120  2 hours after meals.  Spouse is  assisting with her  recovery.  She denies any ongoing health issues and says she does not need a referral to one of the [place] chronic disease management programs.  She does not have the hospital indemnity, she currently on shortterm disability , encouraged to contact benefit office regarding questions as she discussed she will probably not be able to return to work prior to delivery, she reports having benefits contact number.  She does not use a [facility] outpatient pharmacy  She denies educational needs related to staying safe during the COVID 19 pandemic.    Objective:  Per the electronic medical record, Sandra Caldwell  was hospitalized at [facility]  from 9/6-04/08/20 with/for Covid 19 pneumonia,Pregnancy in third trimester   . Comorbidities include: Diet controlled gestational diabetes, Positive covid test 03/31/20.  She was discharged to home on 04/08/20 without the need for home health services or durable medical equipment per the discharge summary.    Assessment:  Patient voices good understanding of all  discharge instructions.  See transition of care flowsheet for assessment details.Patient has completed outpatient Remdesivir infusions and post discharge visit with OB.    Plan:  Reviewed hospital discharge diagnosis of covid 19 virus   and discharge treatment plan using hospital discharge instructions, assessing medication adherence, reviewing problems requiring provider notification, and discussing the importance of follow up with surgeon, primary care provider and/or specialists as directed.  Reviewed [place] healthy lifestyle program information to receive discounted premium for  2022   Step 1: Get  your annual physical  Step 2: Complete your health assessment  Step 3:Identify your current health status and complete the corresponding action step between January 1, and April 01, 2020.     No ongoing care management needs identified so will close case to [facility] Care Management services. Patient has been sent unsuccessful outreach letter after initial outreach attempt on 9/10. Thanked patient for their services to [facility].   Egbert Garibaldi, RN, BSN  [facility] Care Management,Care Management Coordinator  [phone]- Mobile [phone]- Toll Free Main Office

## 2020-04-21 DIAGNOSIS — O0993 Supervision of high risk pregnancy, unspecified, third trimester: Secondary | ICD-10-CM | POA: Diagnosis not present

## 2020-04-22 DIAGNOSIS — Z79899 Other long term (current) drug therapy: Secondary | ICD-10-CM | POA: Diagnosis not present

## 2020-04-22 DIAGNOSIS — O1424 HELLP syndrome, complicating childbirth: Secondary | ICD-10-CM | POA: Diagnosis not present

## 2020-04-22 DIAGNOSIS — O24410 Gestational diabetes mellitus in pregnancy, diet controlled: Secondary | ICD-10-CM | POA: Diagnosis not present

## 2020-04-22 DIAGNOSIS — D563 Thalassemia minor: Secondary | ICD-10-CM | POA: Diagnosis not present

## 2020-04-22 DIAGNOSIS — O9089 Other complications of the puerperium, not elsewhere classified: Secondary | ICD-10-CM | POA: Diagnosis not present

## 2020-04-22 DIAGNOSIS — Z8616 Personal history of COVID-19: Secondary | ICD-10-CM | POA: Diagnosis not present

## 2020-04-22 DIAGNOSIS — O9852 Other viral diseases complicating childbirth: Secondary | ICD-10-CM | POA: Diagnosis not present

## 2020-04-22 DIAGNOSIS — Z3A38 38 weeks gestation of pregnancy: Secondary | ICD-10-CM | POA: Diagnosis not present

## 2020-04-22 DIAGNOSIS — Z882 Allergy status to sulfonamides status: Secondary | ICD-10-CM | POA: Diagnosis not present

## 2020-04-22 DIAGNOSIS — N858 Other specified noninflammatory disorders of uterus: Secondary | ICD-10-CM | POA: Diagnosis not present

## 2020-04-23 DIAGNOSIS — O24410 Gestational diabetes mellitus in pregnancy, diet controlled: Secondary | ICD-10-CM | POA: Diagnosis not present

## 2020-04-23 DIAGNOSIS — O1424 HELLP syndrome, complicating childbirth: Secondary | ICD-10-CM | POA: Diagnosis not present

## 2020-04-23 DIAGNOSIS — Z3A38 38 weeks gestation of pregnancy: Secondary | ICD-10-CM | POA: Diagnosis not present

## 2020-04-23 DIAGNOSIS — Z8616 Personal history of COVID-19: Secondary | ICD-10-CM | POA: Diagnosis not present

## 2020-04-23 DIAGNOSIS — O9852 Other viral diseases complicating childbirth: Secondary | ICD-10-CM | POA: Diagnosis not present

## 2020-05-01 DIAGNOSIS — O1424 HELLP syndrome, complicating childbirth: Secondary | ICD-10-CM | POA: Diagnosis not present

## 2020-05-27 DIAGNOSIS — Z882 Allergy status to sulfonamides status: Secondary | ICD-10-CM | POA: Diagnosis not present

## 2020-05-27 DIAGNOSIS — R7989 Other specified abnormal findings of blood chemistry: Secondary | ICD-10-CM | POA: Diagnosis not present

## 2020-05-27 DIAGNOSIS — Z79899 Other long term (current) drug therapy: Secondary | ICD-10-CM | POA: Diagnosis not present

## 2020-05-27 DIAGNOSIS — R102 Pelvic and perineal pain: Secondary | ICD-10-CM | POA: Diagnosis not present

## 2020-05-27 DIAGNOSIS — N898 Other specified noninflammatory disorders of vagina: Secondary | ICD-10-CM | POA: Diagnosis not present

## 2020-05-27 DIAGNOSIS — R109 Unspecified abdominal pain: Secondary | ICD-10-CM | POA: Diagnosis not present

## 2020-06-04 DIAGNOSIS — Z3482 Encounter for supervision of other normal pregnancy, second trimester: Secondary | ICD-10-CM | POA: Diagnosis not present

## 2020-06-04 DIAGNOSIS — Z3483 Encounter for supervision of other normal pregnancy, third trimester: Secondary | ICD-10-CM | POA: Diagnosis not present

## 2020-06-10 DIAGNOSIS — Z8632 Personal history of gestational diabetes: Secondary | ICD-10-CM | POA: Diagnosis not present

## 2020-06-10 DIAGNOSIS — Z7689 Persons encountering health services in other specified circumstances: Secondary | ICD-10-CM | POA: Diagnosis not present

## 2020-09-25 DIAGNOSIS — Z3482 Encounter for supervision of other normal pregnancy, second trimester: Secondary | ICD-10-CM | POA: Diagnosis not present

## 2020-09-25 DIAGNOSIS — Z3483 Encounter for supervision of other normal pregnancy, third trimester: Secondary | ICD-10-CM | POA: Diagnosis not present

## 2020-11-17 DIAGNOSIS — R52 Pain, unspecified: Secondary | ICD-10-CM | POA: Diagnosis not present

## 2020-11-17 DIAGNOSIS — J029 Acute pharyngitis, unspecified: Secondary | ICD-10-CM | POA: Diagnosis not present

## 2020-11-17 DIAGNOSIS — J189 Pneumonia, unspecified organism: Secondary | ICD-10-CM | POA: Diagnosis not present

## 2020-11-23 DIAGNOSIS — R0981 Nasal congestion: Secondary | ICD-10-CM | POA: Diagnosis not present

## 2020-11-23 DIAGNOSIS — Z882 Allergy status to sulfonamides status: Secondary | ICD-10-CM | POA: Diagnosis not present

## 2020-11-23 DIAGNOSIS — J189 Pneumonia, unspecified organism: Secondary | ICD-10-CM | POA: Diagnosis not present

## 2020-11-23 DIAGNOSIS — R0600 Dyspnea, unspecified: Secondary | ICD-10-CM | POA: Diagnosis not present

## 2020-11-23 DIAGNOSIS — J45909 Unspecified asthma, uncomplicated: Secondary | ICD-10-CM | POA: Diagnosis not present

## 2020-11-23 DIAGNOSIS — J168 Pneumonia due to other specified infectious organisms: Secondary | ICD-10-CM | POA: Diagnosis not present

## 2020-11-23 DIAGNOSIS — R059 Cough, unspecified: Secondary | ICD-10-CM | POA: Diagnosis not present

## 2020-11-23 DIAGNOSIS — R0602 Shortness of breath: Secondary | ICD-10-CM | POA: Diagnosis not present

## 2020-11-23 DIAGNOSIS — Z79899 Other long term (current) drug therapy: Secondary | ICD-10-CM | POA: Diagnosis not present

## 2020-11-23 DIAGNOSIS — R509 Fever, unspecified: Secondary | ICD-10-CM | POA: Diagnosis not present

## 2020-11-23 DIAGNOSIS — Z20822 Contact with and (suspected) exposure to covid-19: Secondary | ICD-10-CM | POA: Diagnosis not present

## 2020-11-24 DIAGNOSIS — R059 Cough, unspecified: Secondary | ICD-10-CM | POA: Diagnosis not present

## 2020-11-24 DIAGNOSIS — R509 Fever, unspecified: Secondary | ICD-10-CM | POA: Diagnosis not present

## 2020-11-24 DIAGNOSIS — Z79899 Other long term (current) drug therapy: Secondary | ICD-10-CM | POA: Diagnosis not present

## 2020-11-24 DIAGNOSIS — R0600 Dyspnea, unspecified: Secondary | ICD-10-CM | POA: Diagnosis not present

## 2020-11-24 DIAGNOSIS — Z882 Allergy status to sulfonamides status: Secondary | ICD-10-CM | POA: Diagnosis not present

## 2020-11-24 DIAGNOSIS — J189 Pneumonia, unspecified organism: Secondary | ICD-10-CM | POA: Diagnosis not present

## 2020-11-24 DIAGNOSIS — J168 Pneumonia due to other specified infectious organisms: Secondary | ICD-10-CM | POA: Diagnosis not present

## 2020-11-24 DIAGNOSIS — J45909 Unspecified asthma, uncomplicated: Secondary | ICD-10-CM | POA: Diagnosis not present

## 2020-11-24 DIAGNOSIS — R0981 Nasal congestion: Secondary | ICD-10-CM | POA: Diagnosis not present

## 2020-11-24 DIAGNOSIS — Z20822 Contact with and (suspected) exposure to covid-19: Secondary | ICD-10-CM | POA: Diagnosis not present

## 2020-12-16 DIAGNOSIS — Z20822 Contact with and (suspected) exposure to covid-19: Secondary | ICD-10-CM | POA: Diagnosis not present

## 2020-12-16 DIAGNOSIS — K810 Acute cholecystitis: Secondary | ICD-10-CM | POA: Diagnosis not present

## 2020-12-16 DIAGNOSIS — Z882 Allergy status to sulfonamides status: Secondary | ICD-10-CM | POA: Diagnosis not present

## 2020-12-16 DIAGNOSIS — K8000 Calculus of gallbladder with acute cholecystitis without obstruction: Secondary | ICD-10-CM | POA: Diagnosis not present

## 2020-12-16 DIAGNOSIS — E669 Obesity, unspecified: Secondary | ICD-10-CM | POA: Diagnosis not present

## 2020-12-16 DIAGNOSIS — I10 Essential (primary) hypertension: Secondary | ICD-10-CM | POA: Diagnosis not present

## 2020-12-16 DIAGNOSIS — K8020 Calculus of gallbladder without cholecystitis without obstruction: Secondary | ICD-10-CM | POA: Diagnosis not present

## 2020-12-16 DIAGNOSIS — Z9049 Acquired absence of other specified parts of digestive tract: Secondary | ICD-10-CM | POA: Diagnosis not present

## 2020-12-16 DIAGNOSIS — J45909 Unspecified asthma, uncomplicated: Secondary | ICD-10-CM | POA: Diagnosis not present

## 2020-12-16 DIAGNOSIS — Z6830 Body mass index (BMI) 30.0-30.9, adult: Secondary | ICD-10-CM | POA: Diagnosis not present

## 2020-12-17 DIAGNOSIS — Z882 Allergy status to sulfonamides status: Secondary | ICD-10-CM | POA: Diagnosis not present

## 2020-12-17 DIAGNOSIS — I10 Essential (primary) hypertension: Secondary | ICD-10-CM | POA: Diagnosis not present

## 2020-12-17 DIAGNOSIS — K810 Acute cholecystitis: Secondary | ICD-10-CM | POA: Diagnosis not present

## 2020-12-17 DIAGNOSIS — K8020 Calculus of gallbladder without cholecystitis without obstruction: Secondary | ICD-10-CM | POA: Diagnosis not present

## 2020-12-17 DIAGNOSIS — Z6830 Body mass index (BMI) 30.0-30.9, adult: Secondary | ICD-10-CM | POA: Diagnosis not present

## 2020-12-17 DIAGNOSIS — E669 Obesity, unspecified: Secondary | ICD-10-CM | POA: Diagnosis not present

## 2020-12-17 DIAGNOSIS — K8000 Calculus of gallbladder with acute cholecystitis without obstruction: Secondary | ICD-10-CM | POA: Diagnosis not present

## 2020-12-17 DIAGNOSIS — Z9049 Acquired absence of other specified parts of digestive tract: Secondary | ICD-10-CM | POA: Diagnosis not present

## 2020-12-17 DIAGNOSIS — Z20822 Contact with and (suspected) exposure to covid-19: Secondary | ICD-10-CM | POA: Diagnosis not present

## 2020-12-17 DIAGNOSIS — J45909 Unspecified asthma, uncomplicated: Secondary | ICD-10-CM | POA: Diagnosis not present

## 2020-12-30 DIAGNOSIS — Z9049 Acquired absence of other specified parts of digestive tract: Secondary | ICD-10-CM | POA: Diagnosis not present

## 2021-01-01 DIAGNOSIS — F419 Anxiety disorder, unspecified: Secondary | ICD-10-CM | POA: Diagnosis not present

## 2021-01-01 DIAGNOSIS — R41840 Attention and concentration deficit: Secondary | ICD-10-CM | POA: Diagnosis not present

## 2021-01-01 DIAGNOSIS — F53 Postpartum depression: Secondary | ICD-10-CM | POA: Diagnosis not present

## 2021-01-01 DIAGNOSIS — O99345 Other mental disorders complicating the puerperium: Secondary | ICD-10-CM | POA: Diagnosis not present

## 2021-01-12 DIAGNOSIS — F321 Major depressive disorder, single episode, moderate: Secondary | ICD-10-CM | POA: Diagnosis not present

## 2021-01-27 DIAGNOSIS — Z111 Encounter for screening for respiratory tuberculosis: Secondary | ICD-10-CM | POA: Diagnosis not present

## 2021-01-29 DIAGNOSIS — Z111 Encounter for screening for respiratory tuberculosis: Secondary | ICD-10-CM | POA: Diagnosis not present

## 2021-02-17 ENCOUNTER — Encounter (HOSPITAL_COMMUNITY): Payer: Self-pay

## 2021-02-17 ENCOUNTER — Ambulatory Visit (HOSPITAL_COMMUNITY)
Admission: RE | Admit: 2021-02-17 | Discharge: 2021-02-17 | Disposition: A | Discharge status: Home / Self Care | Payer: 59 | Source: Ambulatory Visit | Attending: Internal Medicine | Admitting: Internal Medicine

## 2021-02-17 ENCOUNTER — Ambulatory Visit (INDEPENDENT_AMBULATORY_CARE_PROVIDER_SITE_OTHER): Payer: 59

## 2021-02-17 ENCOUNTER — Other Ambulatory Visit (HOSPITAL_COMMUNITY): Payer: Self-pay

## 2021-02-17 ENCOUNTER — Other Ambulatory Visit: Payer: Self-pay

## 2021-02-17 VITALS — BP 116/78 | HR 87 | Temp 98.1°F | Resp 17

## 2021-02-17 DIAGNOSIS — R0602 Shortness of breath: Secondary | ICD-10-CM

## 2021-02-17 DIAGNOSIS — R059 Cough, unspecified: Secondary | ICD-10-CM

## 2021-02-17 DIAGNOSIS — R053 Chronic cough: Secondary | ICD-10-CM

## 2021-02-17 MED ORDER — FLUOXETINE HCL 10 MG PO CAPS
10.0000 mg | ORAL_CAPSULE | Freq: Every day | ORAL | 1 refills | Status: AC
  Filled 2021-02-17: qty 30, 30d supply, fill #0
  Filled 2021-04-01: qty 30, 30d supply, fill #1

## 2021-02-17 MED ORDER — BENZONATATE 100 MG PO CAPS
100.0000 mg | ORAL_CAPSULE | Freq: Three times a day (TID) | ORAL | 0 refills | Status: AC | PRN
  Filled 2021-02-17: qty 21, 7d supply, fill #0

## 2021-02-17 MED ORDER — PREDNISONE 10 MG PO TABS
ORAL_TABLET | ORAL | 0 refills | Status: AC
  Filled 2021-02-17: qty 30, 10d supply, fill #0

## 2021-02-17 MED ORDER — BUSPIRONE HCL 10 MG PO TABS
10.0000 mg | ORAL_TABLET | Freq: Two times a day (BID) | ORAL | 0 refills | Status: AC
  Filled 2021-02-17: qty 60, 30d supply, fill #0

## 2021-02-17 NOTE — ED Triage Notes (Signed)
Pt presents with chronic productive cough X 4 months with some shortness of breath.  Pt has Hx of pneumonia.

## 2021-02-17 NOTE — Discharge Instructions (Addendum)
Please call provided contact information for pulmonology to set up appointment for further evaluation and management.  You have been prescribed prednisone steroid and benzonatate to take as needed for cough.  Please go to the hospital if shortness of breath worsens or becomes constant.

## 2021-02-17 NOTE — ED Provider Notes (Signed)
[facility]    CSN: [id number] Arrival date & time: 02/17/21  1542      History   Chief Complaint Chief Complaint  Patient presents with   APPOINTMENT : Cough    HPI Sandra Caldwell is a 30 y.o. female.   Patient presents with 79-month history of productive cough and intermittent shortness of breath with exertion.  Patient was found to have right sided pneumonia on CT scan in April.  Was treated with Augmentin, doxycycline, prednisone, albuterol at that time.  Patient states that symptoms slightly improved while on medication but cough has been persistent and returned once medication was completed.  Denies any upper respiratory symptoms.  Denies any known fevers at home.  Patient has a history of asthma where she takes albuterol nebulizer and inhaler as needed.  Denies any chest pain.    Past Medical History:  Diagnosis Date   Anxiety    Asthma    Thalassemia minor     Patient Active Problem List   Diagnosis Date Noted   [redacted] weeks gestation of pregnancy 04/08/2020   COVID-19 affecting pregnancy in third trimester 04/07/2020   Shortness of breath    Acute respiratory disease due to COVID-19 virus 04/06/2020    Past Surgical History:  Procedure Laterality Date   TONSILLECTOMY AND ADENOIDECTOMY      OB History     Gravida  1   Para      Term      Preterm      AB      Living         SAB      IAB      Ectopic      Multiple      Live Births               Home Medications    Prior to Admission medications   Medication Sig Start Date End Date Taking? Authorizing Provider  benzonatate (TESSALON) 100 MG capsule Take 1 capsule (100 mg total) by mouth every 8 (eight) hours as needed for cough. 02/17/21  Yes Lance Muss, FNP  predniSONE (DELTASONE) 10 MG tablet Take 5 tablets (50 mg total) by mouth daily for 2 days, THEN 4 tablets (40 mg total) daily for 2 days, THEN 3 tablets (30 mg total) daily for 2 days, THEN 2 tablets (20 mg total)  daily for 2 days, THEN 1 tablet (10 mg total) daily for 2 days. 02/17/21 02/27/21 Yes Lance Muss, FNP  acetaminophen (TYLENOL) 500 MG tablet Take 500 mg by mouth every 6 (six) hours as needed for mild pain, fever or headache.     [provider]  albuterol (ACCUNEB) 1.25 MG/3ML nebulizer solution Take 1 ampule by nebulization every 6 (six) hours as needed for wheezing.    [provider]  albuterol (VENTOLIN HFA) 108 (90 Base) MCG/ACT inhaler Inhale 2 puffs into the lungs every 6 (six) hours as needed for wheezing or shortness of breath.    [provider]  Ascorbic Acid (VITAMIN C PO) Take 1 tablet by mouth daily.    [provider]  busPIRone (BUSPAR) 10 MG tablet Take 1 tablet (10 mg total) by mouth 2 (two) times daily. 01/01/21   Rinkenberg, Baxter Hire E, PA  FLUoxetine (PROZAC) 10 MG capsule Take 1 capsule (10 mg total) by mouth daily. 01/01/21   Rinkenberg, Enis Slipper, PA  Multiple Vitamins-Minerals (ZINC PO) Take 1 tablet by mouth daily.    [provider]  pseudoephedrine-acetaminophen (TYLENOL SINUS) 30-500 MG TABS tablet Take 1 tablet by mouth every 4 (four) hours as needed (congestion).    [provider]    Family History Family History  Family history unknown: Yes    Social History Social History   Tobacco Use   Smoking status: Never   Smokeless tobacco: Never  Vaping Use   Vaping Use: Never used  Substance Use Topics   Alcohol use: Not Currently   Drug use: Never     Allergies   Sulfa antibiotics   Review of Systems Review of Systems Per HPI  Physical Exam Triage Vital Signs ED Triage Vitals  Enc Vitals Group     BP 02/17/21 1619 116/78     Pulse Rate 02/17/21 1619 87     Resp 02/17/21 1619 17     Temp 02/17/21 1619 98.1 F (36.7 C)     Temp Source 02/17/21 1619 Oral     SpO2 02/17/21 1619 99 %     Weight --      Height --      Head Circumference --      Peak Flow --      Pain Score 02/17/21 1620 1      Pain Loc --      Pain Edu? --      Excl. in GC? --    No data found.  Updated Vital Signs BP 116/78 (BP Location: Right Arm)   Pulse 87   Temp 98.1 F (36.7 C) (Oral)   Resp 17   LMP 01/14/2021   SpO2 99%   Visual Acuity Right Eye Distance:   Left Eye Distance:   Bilateral Distance:    Right Eye Near:   Left Eye Near:    Bilateral Near:     Physical Exam Constitutional:      General: She is not in acute distress.    Appearance: Normal appearance.  HENT:     Head: Normocephalic and atraumatic.     Right Ear: Hearing, tympanic membrane and ear canal normal.     Left Ear: Hearing, tympanic membrane and ear canal normal.     Nose: Nose normal.     Mouth/Throat:     Pharynx: Oropharynx is clear. No posterior oropharyngeal erythema.  Eyes:     Extraocular Movements: Extraocular movements intact.     Conjunctiva/sclera: Conjunctivae normal.  Cardiovascular:     Rate and Rhythm: Normal rate and regular rhythm.     Pulses: Normal pulses.     Heart sounds: Normal heart sounds.  Pulmonary:     Effort: Pulmonary effort is normal. No respiratory distress.     Breath sounds: Normal breath sounds. No wheezing, rhonchi or rales.  Skin:    General: Skin is warm and dry.  Neurological:     General: No focal deficit present.     Mental Status: She is alert and oriented to person, place, and time. Mental status is at baseline.  Psychiatric:        Mood and Affect: Mood normal.        Behavior: Behavior normal.        Thought Content: Thought content normal.        Judgment: Judgment normal.     UC Treatments / Results  Labs (all labs ordered are listed, but only abnormal results are displayed) Labs Reviewed - No data to display  EKG   Radiology DG Chest 2 View  Result Date: 02/17/2021 CLINICAL DATA:  Shortness of breath. Cough for 4 months. History of pneumonia in April of 2022. EXAM: CHEST - 2 VIEW COMPARISON:  Chest radiograph 04/07/2019 FINDINGS: The  cardiomediastinal contours are normal. The lungs are clear. Pulmonary vasculature is normal. No consolidation, pleural effusion, or pneumothorax. No acute osseous abnormalities are seen. IMPRESSION: Negative radiographs of the chest.  No evidence of pneumonia. Electronically Signed   By: Narda Rutherford M.D.   On: 02/17/2021 17:20    Procedures Procedures (including critical care time)  Medications Ordered in UC Medications - No data to display  Initial Impression / Assessment and Plan / UC Course  I have reviewed the triage vital signs and the nursing notes.  Pertinent labs & imaging results that were available during my care of the patient were reviewed by me and considered in my medical decision making (see chart for details).     Chest x-ray was negative for any acute cardiopulmonary process.  Differential diagnoses include viral cause to cough or asthma exacerbation.  Will treat with prednisone taper x10 days and benzonatate as needed for cough.  Patient advised to use albuterol inhaler more frequently especially for shortness of breath.  Patient referred to pulmonology for further evaluation and management.  Advised patient to go to the hospital shortness if breath worsens or becomes more constant. Discussed strict return precautions. Patient verbalized understanding and is agreeable with plan.  Final Clinical Impressions(s) / UC Diagnoses   Final diagnoses:  Persistent cough for 3 weeks or longer  Shortness of breath     Discharge Instructions      Please call provided contact information for pulmonology to set up appointment for further evaluation and management.  You have been prescribed prednisone steroid and benzonatate to take as needed for cough.  Please go to the hospital if shortness of breath worsens or becomes constant.     ED Prescriptions     Medication Sig Dispense Auth. Provider   predniSONE (DELTASONE) 10 MG tablet Take 5 tablets (50 mg total) by mouth  daily for 2 days, THEN 4 tablets (40 mg total) daily for 2 days, THEN 3 tablets (30 mg total) daily for 2 days, THEN 2 tablets (20 mg total) daily for 2 days, THEN 1 tablet (10 mg total) daily for 2 days. 30 tablet Lance Muss, FNP   benzonatate (TESSALON) 100 MG capsule Take 1 capsule (100 mg total) by mouth every 8 (eight) hours as needed for cough. 21 capsule Lance Muss, FNP      PDMP not reviewed this encounter.   Lance Muss, FNP 02/17/21 [phone]

## 2021-02-18 ENCOUNTER — Other Ambulatory Visit (HOSPITAL_COMMUNITY): Payer: Self-pay

## 2021-04-01 ENCOUNTER — Other Ambulatory Visit (HOSPITAL_COMMUNITY): Payer: Self-pay

## 2021-04-09 ENCOUNTER — Ambulatory Visit: Payer: 59 | Admitting: Emergency Medicine

## 2021-04-09 ENCOUNTER — Other Ambulatory Visit: Payer: Self-pay

## 2021-04-09 ENCOUNTER — Encounter: Payer: Self-pay | Admitting: Emergency Medicine

## 2021-04-09 DIAGNOSIS — R053 Chronic cough: Secondary | ICD-10-CM | POA: Insufficient documentation

## 2021-04-09 MED ORDER — PANTOPRAZOLE SODIUM 40 MG PO TBEC: 40.0000 mg | DELAYED_RELEASE_TABLET | Freq: Every day | ORAL | 2 refills | Status: AC

## 2021-04-09 NOTE — Addendum Note (Signed)
Addended by: Dorisann Frames R on: 04/09/2021 10:51 AM   Modules accepted: Orders

## 2021-04-09 NOTE — Patient Instructions (Addendum)
Please continue your fluticasone nasal spray, 2 sprays each nostril twice a day.  Try not to take this right before laying down for bed. Continue your cetirizine every day as you have been taking it. Try to start doing nasal saline washes once daily. Start pantoprazole 40 mg once daily until next visit.  Take this medication 1 hour around food. We will try to obtain a copy of your CT scan of the chest that was done at [facility]. We will repeat your CT chest without contrast to follow for resolution of your pneumonia We will perform pulmonary function testing in next office visit Follow with Dr. Delton Coombes next available with full pulmonary function testing on the same day.

## 2021-04-09 NOTE — Progress Notes (Signed)
Subjective:    Patient ID: Sandra Caldwell, female    DOB: 05/29/1991, 30 y.o.   MRN: [id number]  HPI 30 year old never smoker with a history of asthma that was diagnosed .  Also with thalassemia minor and anxiety.  She is referred today for evaluation of chronic persistent cough.  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 She recovered and had no cough. Had acute cholecystitis in May and is post-chole. Then in 10/2020 developed dyspnea, cough, non-productive. Underwent CT chest at [facility] 4/22, dx with R PNA and rx with pred + abx. She improved in general but kept a dry cough, now a bit more active with increased allergies last few weeks. No overt GERD.    Has been treated for PNA w Augmentin and with pred x 2.   Had a CT chest 10/2020, ? R PNA. Films not available to me currently  CXR 02/17/21 reviewed by me, shows no infiltrates, normal cardiac shadow.   Review of Systems As per HPI  Past Medical History:  Diagnosis Date   Anxiety    Asthma    Thalassemia minor      Family History  Family history unknown: Yes     Social History   Socioeconomic History   Marital status: Married    Spouse name: Not on file   Number of children: Not on file   Years of education: Not on file   Highest education level: Not on file  Occupational History   Not on file  Tobacco Use   Smoking status: Never   Smokeless tobacco: Never  Vaping Use   Vaping Use: Never used  Substance and Sexual Activity   Alcohol use: Not Currently   Drug use: Never   Sexual activity: Yes  Other Topics Concern   Not on file  Social History Narrative   Not on file   Social Determinants of Health   Financial Resource Strain: Not on file  Food Insecurity: Not on file  Transportation Needs: Not on file  Physical Activity: Not on file  Stress: Not on file  Social Connections: Not on file  Intimate Partner Violence: Not on file    She is a  Education officer, environmental, works in radiology No other inhaled exposures.  From [place] Has 2 dogs. Never birds.   Allergies  Allergen Reactions   Sulfa Antibiotics Nausea Only     Outpatient Medications Prior to Visit  Medication Sig Dispense Refill   acetaminophen (TYLENOL) 500 MG tablet Take 500 mg by mouth every 6 (six) hours as needed for mild pain, fever or headache.      albuterol (ACCUNEB) 1.25 MG/3ML nebulizer solution Take 1 ampule by nebulization every 6 (six) hours as needed for wheezing.     albuterol (VENTOLIN HFA) 108 (90 Base) MCG/ACT inhaler Inhale 2 puffs into the lungs every 6 (six) hours as needed for wheezing or shortness of breath.     amoxicillin-clavulanate (AUGMENTIN) 875-125 MG tablet Take by mouth.     Ascorbic Acid (VITAMIN C PO) Take 1 tablet by mouth daily.     busPIRone (BUSPAR) 10 MG tablet Take 1 tablet (10 mg total) by mouth 2 (two) times daily. 60 tablet 0   FLUoxetine (PROZAC) 10 MG capsule Take 1 capsule (10 mg total) by mouth daily. 30 capsule 1   fluticasone (FLONASE) 50 MCG/ACT nasal spray Use 2 squirts to each nostril 2 times daily for 7-10 days, then 2 squirts  in each nare daily     Multiple Vitamins-Minerals (ZINC PO) Take 1 tablet by mouth daily.     pseudoephedrine-acetaminophen (TYLENOL SINUS) 30-500 MG TABS tablet Take 1 tablet by mouth every 4 (four) hours as needed (congestion).     benzonatate (TESSALON) 100 MG capsule Take 1 capsule (100 mg total) by mouth every 8 (eight) hours as needed for cough. (Patient not taking: Reported on 04/09/2021) 21 capsule 0   No facility-administered medications prior to visit.        Objective:   Physical Exam  Vitals:   04/09/21 1007  BP: 124/74  Pulse: (!) 107  Temp: 98.2 F (36.8 C)  TempSrc: Oral  SpO2: 99%  Weight: 178 lb 6.4 oz (80.9 kg)  Height: 5\' 4"  (1.626 m)   Gen: Pleasant, well-nourished, in no distress,  normal affect  ENT: No lesions,  mouth clear,  oropharynx clear, no postnasal drip  Neck: No  JVD, no stridor  Lungs: No use of accessory muscles, no crackles or wheezing on normal respiration, no wheeze on forced expiration  Cardiovascular: RRR, heart sounds normal, no murmur or gallops, no peripheral edema  Musculoskeletal: No deformities, no cyanosis or clubbing  Neuro: alert, awake, non focal  Skin: Warm, no lesions or rash     Assessment & Plan:   Chronic cough Suspect UA irritation with identified contributor of allergic rhinitis.  Consider also occult GERD as a contributor-plan to treat empirically.  Need to rule out asthma since she had childhood asthma, has recently been pregnant, had COVID-19 both which can activate obstructive lung disease.  Finally she had an apparent right upper lobe hazy infiltrate on a CT scan from April 2022.  I cannot see it on chest x-ray (was not seen on chest x-ray in April either).  I needed a copy that CT to look for other potential causes.  We will repeat her CT chest to evaluate for resolution.  Please continue your fluticasone nasal spray, 2 sprays each nostril twice a day.  Try not to take this right before laying down for bed. Continue your cetirizine every day as you have been taking it. Try to start doing nasal saline washes once daily. Start pantoprazole 40 mg once daily until next visit.  Take this medication 1 hour around food. We will try to obtain a copy of your CT scan of the chest that was done at [facility]. We will repeat your CT chest without contrast to follow for resolution of your pneumonia We will perform pulmonary function testing in next office visit Follow with [facility] next available with full pulmonary function testing on the same day.     Delton Coombes, MD, PhD 04/09/2021, 10:43 AM [facility] [phone] or if no answer before 7:00PM call [phone] For any issues after 7:00PM please call eLink [phone]

## 2021-04-09 NOTE — Assessment & Plan Note (Signed)
Suspect UA irritation with identified contributor of allergic rhinitis.  Consider also occult GERD as a contributor-plan to treat empirically.  Need to rule out asthma since she had childhood asthma, has recently been pregnant, had COVID-19 both which can activate obstructive lung disease.  Finally she had an apparent right upper lobe hazy infiltrate on a CT scan from April 2022.  I cannot see it on chest x-ray (was not seen on chest x-ray in April either).  I needed a copy that CT to look for other potential causes.  We will repeat her CT chest to evaluate for resolution.  Please continue your fluticasone nasal spray, 2 sprays each nostril twice a day.  Try not to take this right before laying down for bed. Continue your cetirizine every day as you have been taking it. Try to start doing nasal saline washes once daily. Start pantoprazole 40 mg once daily until next visit.  Take this medication 1 hour around food. We will try to obtain a copy of your CT scan of the chest that was done at [facility]. We will repeat your CT chest without contrast to follow for resolution of your pneumonia We will perform pulmonary function testing in next office visit Follow with Dr. Delton Coombes next available with full pulmonary function testing on the same day.

## 2021-05-27 ENCOUNTER — Encounter: Payer: Self-pay | Admitting: Emergency Medicine

## 2021-05-27 ENCOUNTER — Other Ambulatory Visit: Payer: Self-pay

## 2021-05-27 ENCOUNTER — Ambulatory Visit (INDEPENDENT_AMBULATORY_CARE_PROVIDER_SITE_OTHER): Payer: 59 | Admitting: Emergency Medicine

## 2021-05-27 ENCOUNTER — Other Ambulatory Visit (HOSPITAL_COMMUNITY): Payer: Self-pay

## 2021-05-27 DIAGNOSIS — R053 Chronic cough: Secondary | ICD-10-CM | POA: Diagnosis not present

## 2021-05-27 LAB — PULMONARY FUNCTION TEST
DL/VA % pred: 97 %
DL/VA: 4.52 ml/min/mmHg/L
DLCO cor % pred: 83 %
DLCO cor: 18.62 ml/min/mmHg
DLCO unc % pred: 83 %
DLCO unc: 18.62 ml/min/mmHg
FEF 25-75 Post: 4.01 L/sec
FEF 25-75 Pre: 3.47 L/sec
FEF2575-%Change-Post: 15 %
FEF2575-%Pred-Post: 123 %
FEF2575-%Pred-Pre: 106 %
FEV1-%Change-Post: 2 %
FEV1-%Pred-Post: 107 %
FEV1-%Pred-Pre: 104 %
FEV1-Post: 2.96 L
FEV1-Pre: 2.89 L
FEV1FVC-%Change-Post: 2 %
FEV1FVC-%Pred-Pre: 100 %
FEV6-%Change-Post: 0 %
FEV6-%Pred-Post: 105 %
FEV6-%Pred-Pre: 105 %
FEV6-Post: 3.35 L
FEV6-Pre: 3.36 L
FEV6FVC-%Pred-Post: 101 %
FEV6FVC-%Pred-Pre: 101 %
FVC-%Change-Post: 0 %
FVC-%Pred-Post: 104 %
FVC-%Pred-Pre: 104 %
FVC-Post: 3.35 L
FVC-Pre: 3.36 L
Post FEV1/FVC ratio: 88 %
Post FEV6/FVC ratio: 100 %
Pre FEV1/FVC ratio: 86 %
Pre FEV6/FVC Ratio: 100 %
RV % pred: 109 %
RV: 1.5 L
TLC % pred: 89 %
TLC: 4.5 L

## 2021-05-27 NOTE — Progress Notes (Signed)
Full PFT completed today ? ?

## 2021-05-27 NOTE — Patient Instructions (Signed)
We reviewed your pulmonary function testing today.  Your airflows are normal, no evidence to support asthma.  We will not start any maintenance inhaled medication right now. You can keep your albuterol available to use 2 puffs when you needed for shortness of breath, chest tightness, wheezing. Continue montelukast for now. Try stopping your pantoprazole.  If your cough remains stable for 2 weeks off this medication then you can try stopping the montelukast as well.  If you miss either of these medications (if cough returns) then you should restart them Follow with Dr Delton Coombes if needed

## 2021-05-27 NOTE — Assessment & Plan Note (Signed)
Much improved.  We treated GERD and she also added montelukast since our last visit.  She is on good maintenance allergy regimen.  I think it would be reasonable to try stopping the pantoprazole to see if her cough returns.  If not then she can stay off of it.  She may be able to systematically come off the montelukast as well.  Reviewed strategy with her.  I do not see any evidence for asthma on her pulmonary function testing.  She can keep albuterol available.  No indication to start maintenance ICS or bronchodilator therapy at this time.   We reviewed your pulmonary function testing today.  Your airflows are normal, no evidence to support asthma.  We will not start any maintenance inhaled medication right now. You can keep your albuterol available to use 2 puffs when you needed for shortness of breath, chest tightness, wheezing. Continue montelukast for now. Try stopping your pantoprazole.  If your cough remains stable for 2 weeks off this medication then you can try stopping the montelukast as well.  If you miss either of these medications (if cough returns) then you should restart them Follow with Dr Delton Coombes if needed

## 2021-05-27 NOTE — Progress Notes (Signed)
Subjective:    Patient ID: Sandra Caldwell, female    DOB: 01-08-1991, 30 y.o.   MRN: [id number]  HPI 30 year old never smoker with a history of asthma that was diagnosed.  Also with thalassemia minor and anxiety.  She is referred today for evaluation of chronic persistent cough.  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 She recovered and had no cough. Had acute cholecystitis in May and is post-chole. Then in 10/2020 developed dyspnea, cough, non-productive. Underwent CT chest at [facility] 4/22, dx with R PNA and rx with pred + abx. She improved in general but kept a dry cough, now a bit more active with increased allergies last few weeks. No overt GERD.    Has been treated for PNA w Augmentin and with pred x 2.   Had a CT chest 10/2020, ? R PNA. Films not available to me currently  CXR 02/17/21 reviewed by me, shows no infiltrates, normal cardiac shadow.   ROV 05/27/21 --follow-up visit were never smoker with a history of asthma, thalassemia minor, anxiety.  She was seen in September for chronic persistent cough that started in April 2022.  She was diagnosed with a right-sided pneumonia at that time, treated with prednisone and antibiotics.  She kept a dry cough afterwards.  I started her on empiric pantoprazole, continued cetirizine, fluticasone nasal spray. Today she reports that her cough is much better - she has been taking the pantoprazole, was started by ENT on singulair. She rarely uses albuterol, only w exercise.  She had allergy blood testing done >> dog dander, dust mites.   CT scan of the chest not done.   Pulmonary function testing performed today, reviewed by me showed normal airflows without a bronchodilator response, normal lung volumes, normal diffusion capacity.  Flow volume loop is normal.   Review of Systems As per HPI  Past Medical History:  Diagnosis Date   Anxiety    Asthma    Thalassemia minor       Family History  Family history unknown: Yes     Social History   Socioeconomic History   Marital status: Married    Spouse name: Not on file   Number of children: Not on file   Years of education: Not on file   Highest education level: Not on file  Occupational History   Not on file  Tobacco Use   Smoking status: Never   Smokeless tobacco: Never  Vaping Use   Vaping Use: Never used  Substance and Sexual Activity   Alcohol use: Not Currently   Drug use: Never   Sexual activity: Yes  Other Topics Concern   Not on file  Social History Narrative   Not on file   Social Determinants of Health   Financial Resource Strain: Not on file  Food Insecurity: Not on file  Transportation Needs: Not on file  Physical Activity: Not on file  Stress: Not on file  Social Connections: Not on file  Intimate Partner Violence: Not on file    She is a Education officer, environmental, works in radiology No other inhaled exposures.  From [place] Has 2 dogs. Never birds.   Allergies  Allergen Reactions   Sulfa Antibiotics Nausea Only     Outpatient Medications Prior to Visit  Medication Sig Dispense Refill   acetaminophen (TYLENOL) 500 MG tablet Take 500 mg by mouth every 6 (six) hours as needed for mild pain, fever or headache.  albuterol (ACCUNEB) 1.25 MG/3ML nebulizer solution Take 1 ampule by nebulization every 6 (six) hours as needed for wheezing.     albuterol (VENTOLIN HFA) 108 (90 Base) MCG/ACT inhaler Inhale 2 puffs into the lungs every 6 (six) hours as needed for wheezing or shortness of breath.     Ascorbic Acid (VITAMIN C PO) Take 1 tablet by mouth daily.     busPIRone (BUSPAR) 10 MG tablet Take 1 tablet (10 mg total) by mouth 2 (two) times daily. 60 tablet 0   busPIRone (BUSPAR) 10 MG tablet Take 10 mg by mouth.     FLUoxetine (PROZAC) 10 MG capsule Take 1 capsule (10 mg total) by mouth daily. 30 capsule 1   fluticasone (FLONASE) 50 MCG/ACT nasal spray Use 2 squirts to each nostril 2 times  daily for 7-10 days, then 2 squirts in each nare daily     montelukast (SINGULAIR) 10 MG tablet Take 10 mg by mouth.     pantoprazole (PROTONIX) 40 MG tablet Take 1 tablet (40 mg total) by mouth daily. 30 tablet 2   pseudoephedrine-acetaminophen (TYLENOL SINUS) 30-500 MG TABS tablet Take 1 tablet by mouth every 4 (four) hours as needed (congestion).     Zinc 50 MG TABS Take 50 mg by mouth.     Multiple Vitamins-Minerals (ZINC PO) Take 1 tablet by mouth daily.     benzonatate (TESSALON) 100 MG capsule Take 1 capsule (100 mg total) by mouth every 8 (eight) hours as needed for cough. (Patient not taking: Reported on 05/27/2021) 21 capsule 0   No facility-administered medications prior to visit.        Objective:   Physical Exam  Vitals:   05/27/21 1609  BP: 108/70  Pulse: (!) 105  Temp: 97.7 F (36.5 C)  SpO2: 100%  Weight: 182 lb 12.8 oz (82.9 kg)  Height: 5\' 4"  (1.626 m)   Gen: Pleasant, well-nourished, in no distress,  normal affect  ENT: No lesions,  mouth clear,  oropharynx clear, no postnasal drip  Neck: No JVD, no stridor  Lungs: No use of accessory muscles, no crackles or wheezing on normal respiration, no wheeze on forced expiration  Cardiovascular: RRR, heart sounds normal, no murmur or gallops, no peripheral edema  Musculoskeletal: No deformities, no cyanosis or clubbing  Neuro: alert, awake, non focal  Skin: Warm, no lesions or rash     Assessment & Plan:   Chronic cough Much improved.  We treated GERD and she also added montelukast since our last visit.  She is on good maintenance allergy regimen.  I think it would be reasonable to try stopping the pantoprazole to see if her cough returns.  If not then she can stay off of it.  She may be able to systematically come off the montelukast as well.  Reviewed strategy with her.  I do not see any evidence for asthma on her pulmonary function testing.  She can keep albuterol available.  No indication to start  maintenance ICS or bronchodilator therapy at this time.   We reviewed your pulmonary function testing today.  Your airflows are normal, no evidence to support asthma.  We will not start any maintenance inhaled medication right now. You can keep your albuterol available to use 2 puffs when you needed for shortness of breath, chest tightness, wheezing. Continue montelukast for now. Try stopping your pantoprazole.  If your cough remains stable for 2 weeks off this medication then you can try stopping the montelukast as well.  If  you miss either of these medications (if cough returns) then you should restart them Follow with Dr Delton Coombes if needed    Levy Pupa, MD, PhD 05/27/2021, 4:40 PM  P[facility] [phone] or if no answer before 7:00PM call [phone] For any issues after 7:00PM please call eLink [phone]

## 2021-05-31 ENCOUNTER — Other Ambulatory Visit (HOSPITAL_COMMUNITY): Payer: Self-pay

## 2021-06-02 ENCOUNTER — Other Ambulatory Visit (HOSPITAL_COMMUNITY): Payer: Self-pay

## 2021-06-03 ENCOUNTER — Other Ambulatory Visit (HOSPITAL_COMMUNITY): Payer: Self-pay

## 2021-06-07 ENCOUNTER — Other Ambulatory Visit (HOSPITAL_COMMUNITY): Payer: Self-pay

## 2021-06-09 ENCOUNTER — Other Ambulatory Visit (HOSPITAL_COMMUNITY): Payer: Self-pay

## 2021-07-04 ENCOUNTER — Other Ambulatory Visit: Payer: Self-pay | Admitting: Emergency Medicine

## 2022-02-28 IMAGING — DX DG CHEST 1V PORT
1 series · 1 of 1 positions shown · non-contrast
Comparison: None.

CLINICAL DATA: Short of breath, dizziness and fatigue. Positive
[id number] test last week. Thirty-six weeks pregnant.

EXAM:
PORTABLE CHEST 1 VIEW

[chest]
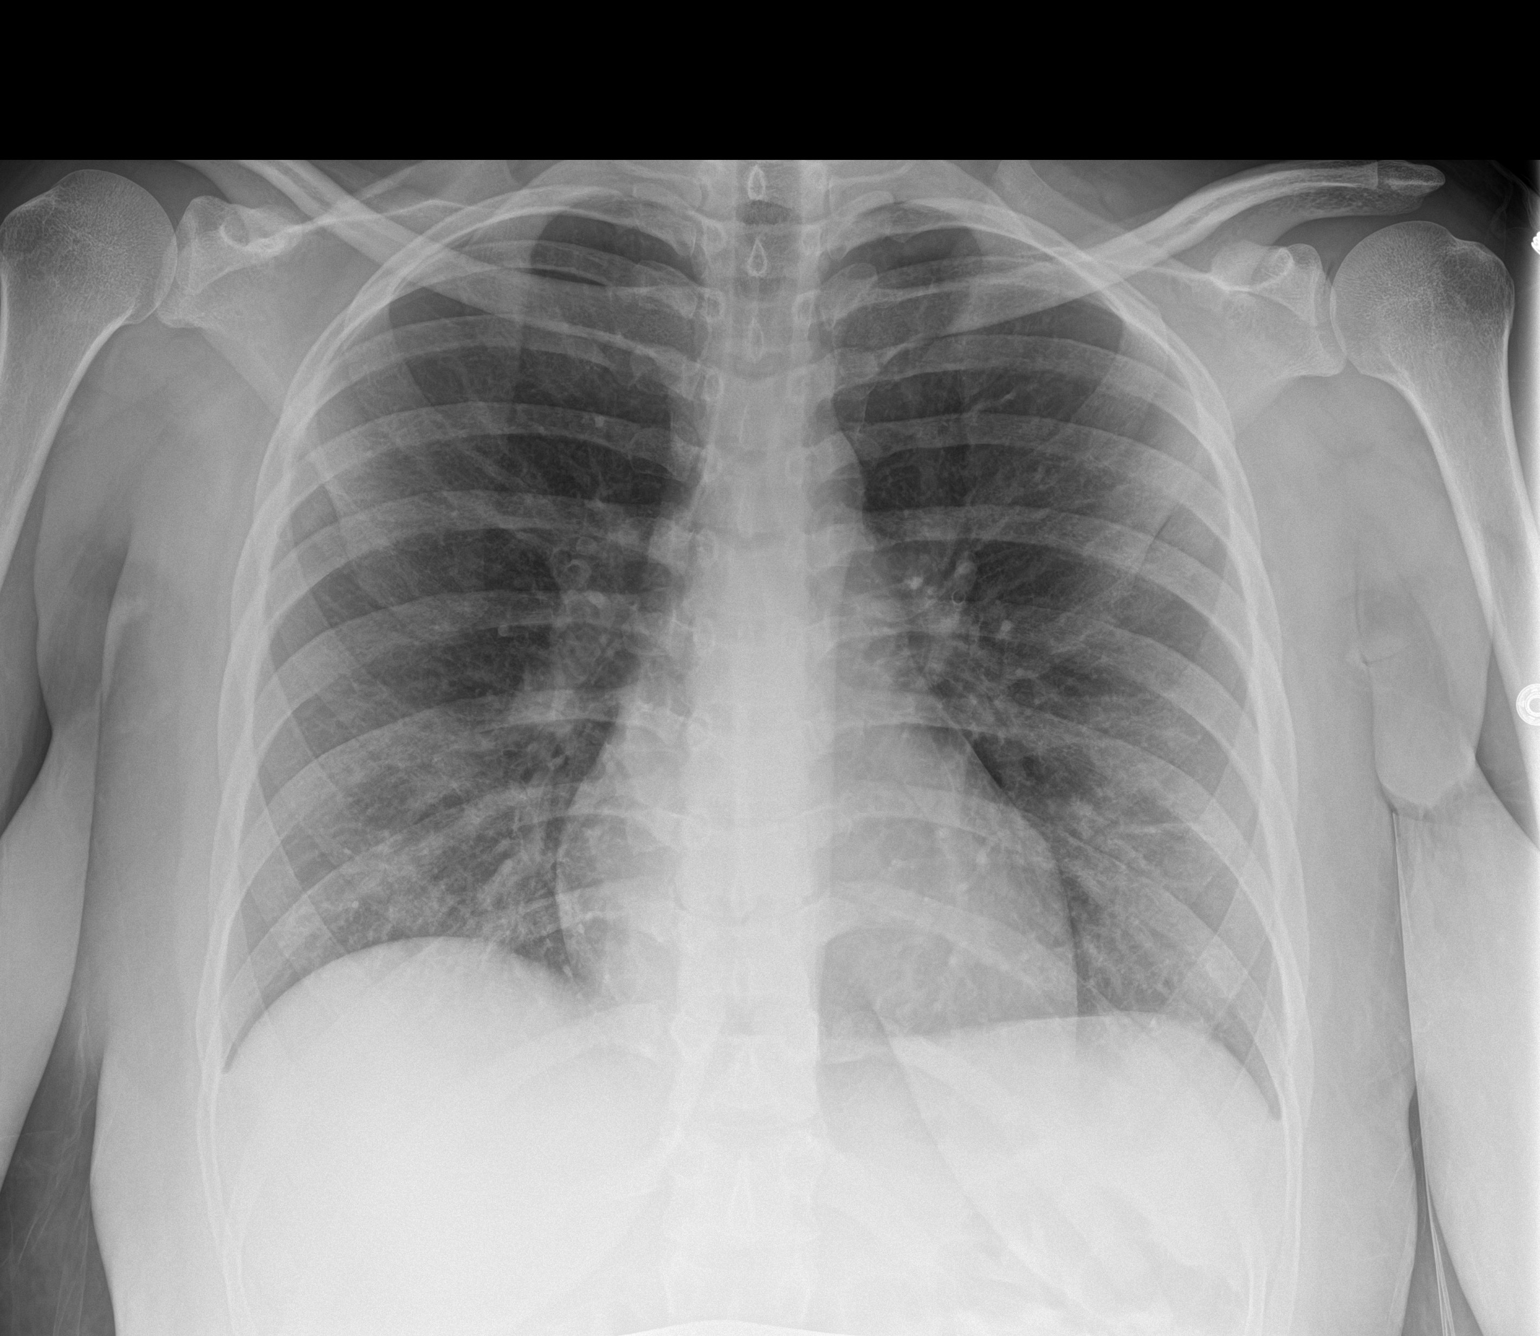

[1 of 1 positions shown; findings below may reference images not displayed]

FINDINGS: Questionable subtle airspace opacities in the lower lungs. Lungs
otherwise clear.

No pleural effusion or pneumothorax.

Normal heart, mediastinum and hila.

Skeletal structures are unremarkable.
IMPRESSION: 1. Possible subtle airspace opacities in the lower lungs. This is
somewhat equivocal and would be better assessed with CT.

## 2023-01-11 IMAGING — DX DG CHEST 2V
2 series · 2 of 2 positions shown · non-contrast
Comparison: Chest radiograph 04/07/2019

CLINICAL DATA: Shortness of breath. Cough for 4 months. History of
pneumonia in Friday October, 2020.

EXAM:
CHEST - 2 VIEW

[chest pa]
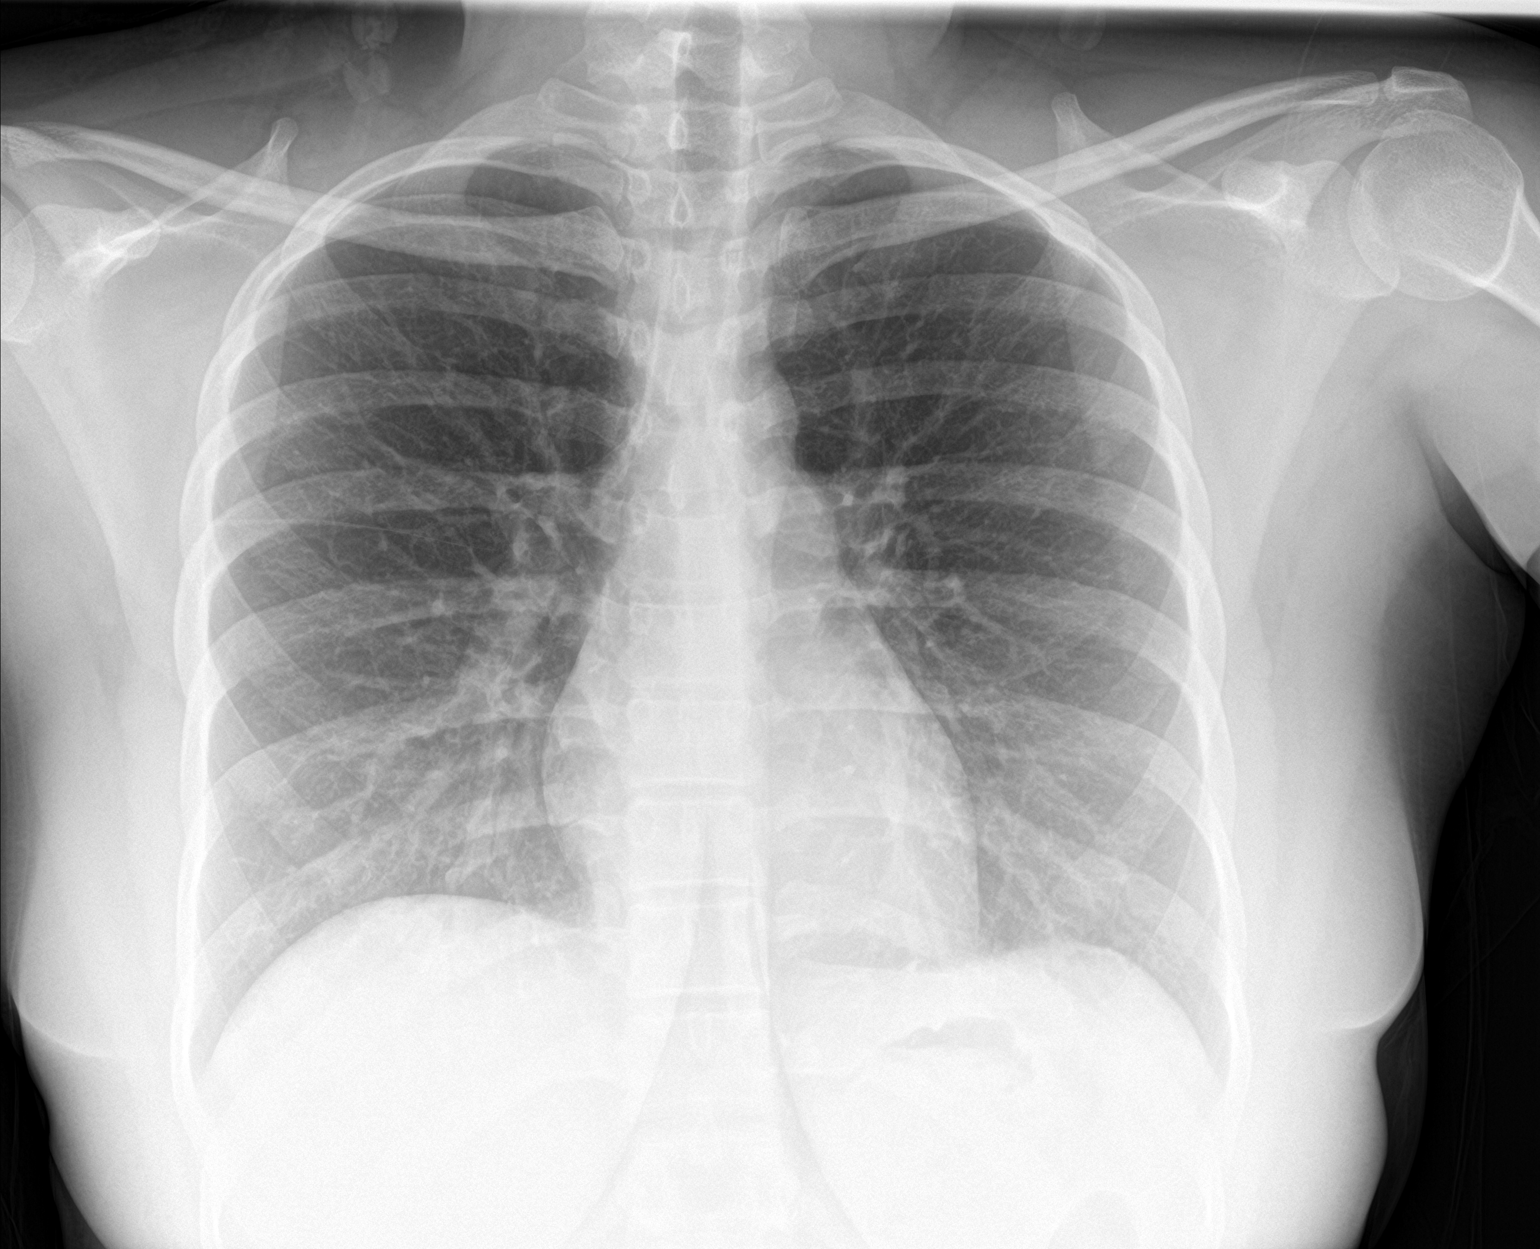

[chest lat]
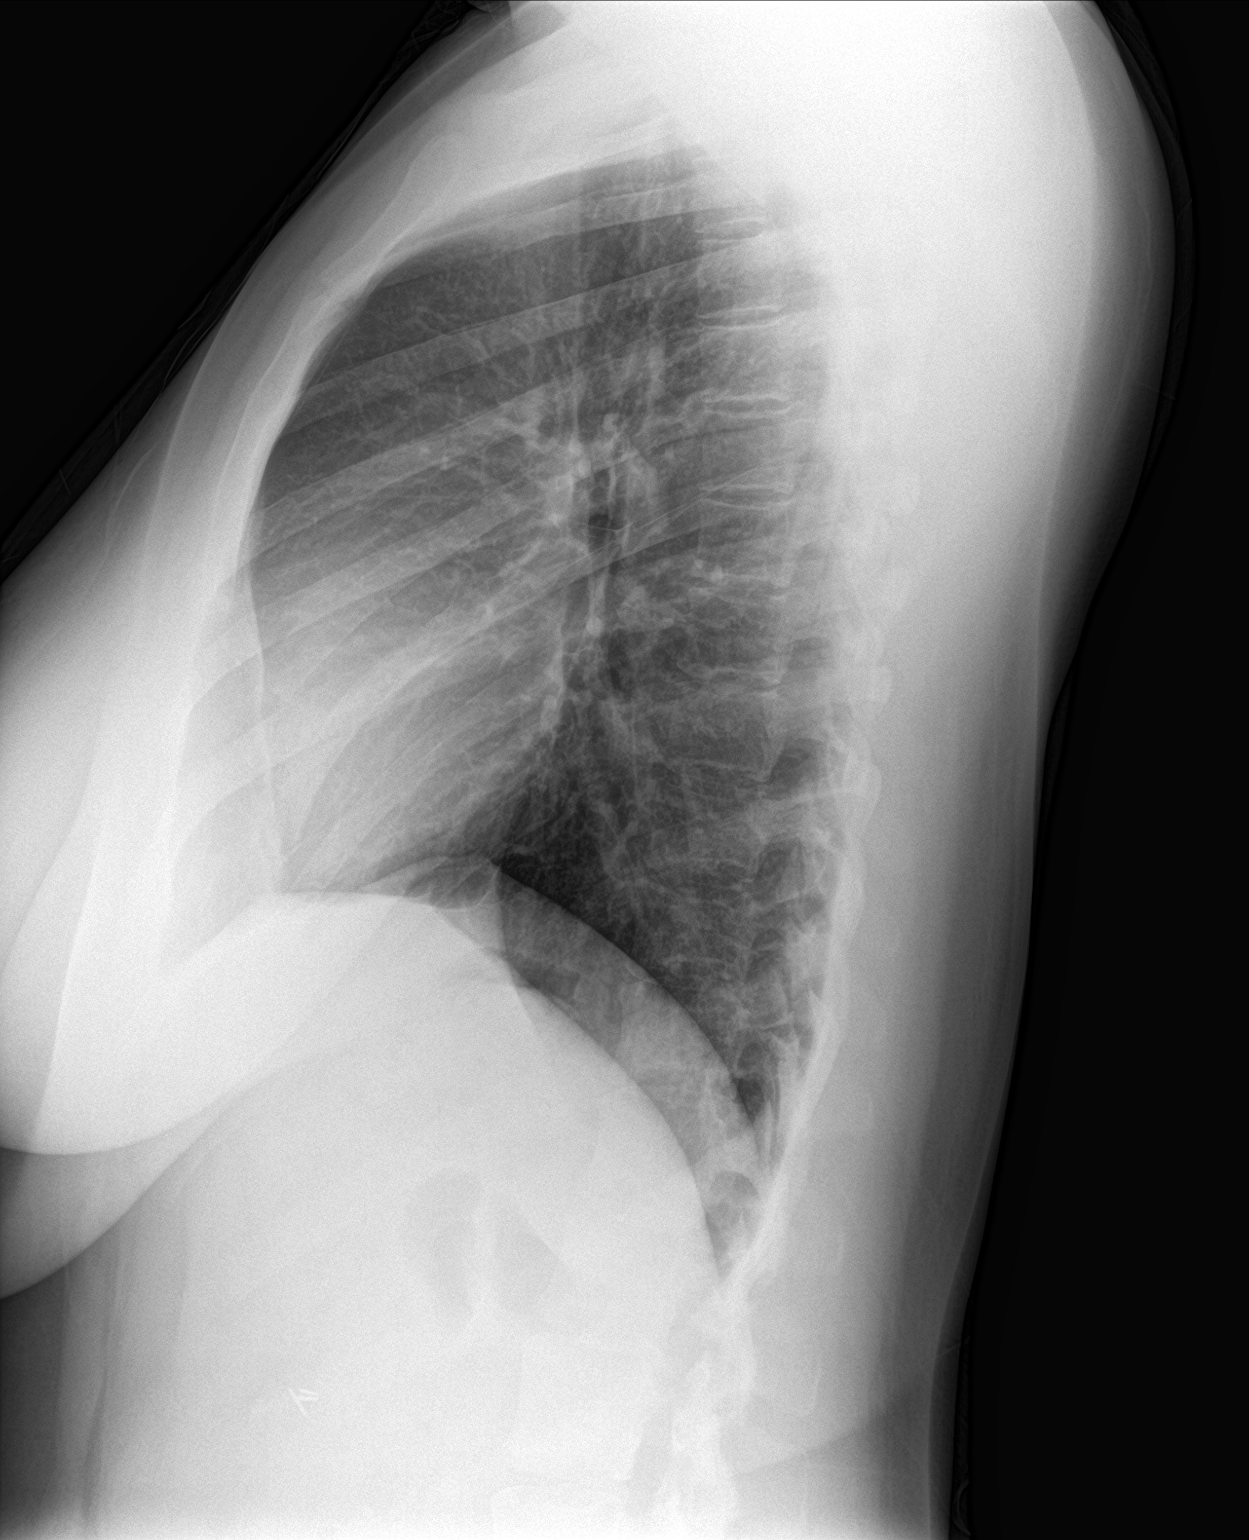

[2 of 2 positions shown; findings below may reference images not displayed]

FINDINGS: The cardiomediastinal contours are normal. The lungs are clear.
Pulmonary vasculature is normal. No consolidation, pleural effusion,
or pneumothorax. No acute osseous abnormalities are seen.
IMPRESSION: Negative radiographs of the chest.  No evidence of pneumonia.
